# Patient Record
Sex: Male | Born: 1970 | Race: White | Hispanic: No | Marital: Single | Smoking: Never smoker
Health system: Southern US, Community
[De-identification: ages and names within clinical notes are randomized; demographics above are authoritative.]

## PROBLEM LIST (undated history)

## (undated) DIAGNOSIS — I10 Essential (primary) hypertension: Secondary | ICD-10-CM

## (undated) DIAGNOSIS — K219 Gastro-esophageal reflux disease without esophagitis: Secondary | ICD-10-CM

## (undated) DIAGNOSIS — R7989 Other specified abnormal findings of blood chemistry: Secondary | ICD-10-CM

## (undated) DIAGNOSIS — K649 Unspecified hemorrhoids: Secondary | ICD-10-CM

## (undated) HISTORY — PX: OTHER SURGICAL HISTORY: SHX169

---

## 2019-01-01 ENCOUNTER — Emergency Department
Admission: EM | Admit: 2019-01-01 | Discharge: 2019-01-01 | Disposition: A | Discharge status: Home / Self Care | Payer: BLUE CROSS/BLUE SHIELD | Attending: Emergency Medicine | Admitting: Emergency Medicine

## 2019-01-01 ENCOUNTER — Other Ambulatory Visit: Payer: Self-pay

## 2019-01-01 ENCOUNTER — Encounter: Payer: Self-pay | Admitting: Emergency Medicine

## 2019-01-01 ENCOUNTER — Emergency Department: Payer: BLUE CROSS/BLUE SHIELD

## 2019-01-01 DIAGNOSIS — R1013 Epigastric pain: Secondary | ICD-10-CM | POA: Diagnosis not present

## 2019-01-01 DIAGNOSIS — R079 Chest pain, unspecified: Secondary | ICD-10-CM | POA: Diagnosis present

## 2019-01-01 DIAGNOSIS — R195 Other fecal abnormalities: Secondary | ICD-10-CM | POA: Diagnosis not present

## 2019-01-01 LAB — COMPREHENSIVE METABOLIC PANEL
ALT: 12 U/L (ref 0–44)
AST: 15 U/L (ref 15–41)
Albumin: 3.8 g/dL (ref 3.5–5.0)
Alkaline Phosphatase: 43 U/L (ref 38–126)
Anion gap: 7 (ref 5–15)
BUN: 33 mg/dL — ABNORMAL HIGH (ref 6–20)
CO2: 25 mmol/L (ref 22–32)
Calcium: 8.4 mg/dL — ABNORMAL LOW (ref 8.9–10.3)
Chloride: 106 mmol/L (ref 98–111)
Creatinine, Ser: 1.06 mg/dL (ref 0.61–1.24)
GFR calc Af Amer: >60 mL/min (ref >60)
GFR calc non Af Amer: >60 mL/min (ref >60)
Glucose, Bld: 123 mg/dL — ABNORMAL HIGH (ref 70–99)
Potassium: 4.3 mmol/L (ref 3.5–5.1)
Sodium: 138 mmol/L (ref 135–145)
Total Bilirubin: 1.3 mg/dL — ABNORMAL HIGH (ref 0.3–1.2)
Total Protein: 6.5 g/dL (ref 6.5–8.1)

## 2019-01-01 LAB — CBC
HCT: 38.6 % — ABNORMAL LOW (ref 39.0–52.0)
Hemoglobin: 13 g/dL (ref 13.0–17.0)
MCH: 31.3 pg (ref 26.0–34.0)
MCHC: 33.7 g/dL (ref 30.0–36.0)
MCV: 92.8 fL (ref 80.0–100.0)
Platelets: 303 10*3/uL (ref 150–400)
RBC: 4.16 MIL/uL — ABNORMAL LOW (ref 4.22–5.81)
RDW: 13 % (ref 11.5–15.5)
WBC: 14.3 10*3/uL — ABNORMAL HIGH (ref 4.0–10.5)
nRBC: 0 % (ref 0.0–0.2)

## 2019-01-01 LAB — CBC WITH DIFFERENTIAL/PLATELET
Abs Immature Granulocytes: 0.1 10*3/uL — ABNORMAL HIGH (ref 0.00–0.07)
Basophils Absolute: 0.1 10*3/uL (ref 0.0–0.1)
Basophils Relative: 1 %
Eosinophils Absolute: 0.1 10*3/uL (ref 0.0–0.5)
Eosinophils Relative: 1 %
HCT: 37.5 % — ABNORMAL LOW (ref 39.0–52.0)
Hemoglobin: 12.6 g/dL — ABNORMAL LOW (ref 13.0–17.0)
Immature Granulocytes: 1 %
Lymphocytes Relative: 15 %
Lymphs Abs: 1.8 10*3/uL (ref 0.7–4.0)
MCH: 31.2 pg (ref 26.0–34.0)
MCHC: 33.6 g/dL (ref 30.0–36.0)
MCV: 92.8 fL (ref 80.0–100.0)
Monocytes Absolute: 0.6 10*3/uL (ref 0.1–1.0)
Monocytes Relative: 5 %
Neutro Abs: 9.6 10*3/uL — ABNORMAL HIGH (ref 1.7–7.7)
Neutrophils Relative %: 77 %
Platelets: 297 10*3/uL (ref 150–400)
RBC: 4.04 MIL/uL — ABNORMAL LOW (ref 4.22–5.81)
RDW: 13 % (ref 11.5–15.5)
WBC: 12.4 10*3/uL — ABNORMAL HIGH (ref 4.0–10.5)
nRBC: 0 % (ref 0.0–0.2)

## 2019-01-01 LAB — TROPONIN I (HIGH SENSITIVITY): Troponin I (High Sensitivity): 3 ng/L (ref <18)

## 2019-01-01 LAB — TYPE AND SCREEN
ABO/RH(D): A POS
Antibody Screen: NEGATIVE

## 2019-01-01 LAB — LIPASE, BLOOD: Lipase: 32 U/L (ref 11–51)

## 2019-01-01 NOTE — ED Notes (Signed)
Rectal exam to obtain stool occult sample by MD Siadecki with this RN at bedside

## 2019-01-01 NOTE — Discharge Instructions (Addendum)
You most likely either have an ulcer in your stomach or gastritis (inflammation of the stomach lining) which is causing the blood to show up in the stool.  You should start taking your Prilosec regularly every day going forward.  Call on Monday to make an appointment with the gastroenterologist (information has been provided in the paper) as well as with your primary care doctor.  Return to the ER immediately for recurrent, worsening, or persistent weakness or lightheadedness, worsening pain, vomiting, any worsening dark stools or especially if you see any actual blood in the stool.

## 2019-01-01 NOTE — ED Provider Notes (Signed)
[facility] Emergency Department Provider Note ____________________________________________   First MD Initiated Contact with Patient 01/01/19 [phone]     (approximate)  I have reviewed the triage vital signs and the nursing notes.   HISTORY  Chief Complaint Chest Pain and GI Bleeding    HPI William Ballard is a 48 y.o. male with no significant PMH who presents with lower chest and epigastric pain over at least the last few months, intermittent course, and somewhat more constant over the last few days.  He reports that it has been associated with dark stools in the last few days.  The patient states that previously for the pain he was given Prilosec and states that he takes it intermittently.  Patient denies nausea or vomiting, diarrhea, fever, or weakness.  However he does report feeling briefly near syncopal early this morning.  This has now resolved.   History reviewed. No pertinent past medical history.  There are no active problems to display for this patient.   History reviewed. No pertinent surgical history.  Prior to Admission medications   Not on File    Allergies Patient has no known allergies.  No family history on file.  Social History Social History   Tobacco Use  . Smoking status: Never Smoker  . Smokeless tobacco: Never Used  Substance Use Topics  . Alcohol use: Not Currently  . Drug use: Not Currently    Review of Systems  Constitutional: No fever. Eyes: No redness. ENT: No sore throat. Cardiovascular: Denies chest pain. Respiratory: Denies shortness of breath. Gastrointestinal: No vomiting or diarrhea.  Genitourinary: Negative for dysuria.  Musculoskeletal: Negative for back pain. Skin: Negative for rash. Neurological: Negative for headache.   ____________________________________________   PHYSICAL EXAM:  VITAL SIGNS: ED Triage Vitals [01/01/19 0029]  Enc Vitals Group     BP 125/83     Pulse Rate 89     Resp 18      Temp 97.9 F (36.6 C)     Temp Source Oral     SpO2 100 %     Weight 200 lb (90.7 kg)     Height 5\' 6"  (1.676 m)     Head Circumference      Peak Flow      Pain Score 5     Pain Loc      Pain Edu?      Excl. in GC?     Constitutional: Alert and oriented. Well appearing and in no acute distress. Eyes: Conjunctivae are normal.  Head: Atraumatic. Nose: No congestion/rhinnorhea. Mouth/Throat: Mucous membranes are moist.   Neck: Normal range of motion.  Cardiovascular: Normal rate, regular rhythm. Good peripheral circulation. Respiratory: Normal respiratory effort.  No retractions.  Gastrointestinal: Soft and nontender. No distention.  Musculoskeletal: No lower extremity edema.  Extremities warm and well perfused.  Neurologic:  Normal speech and language. No gross focal neurologic deficits are appreciated.  Skin:  Skin is warm and dry. No rash noted. Psychiatric: Mood and affect are normal. Speech and behavior are normal.  ____________________________________________   LABS (all labs ordered are listed, but only abnormal results are displayed)  Labs Reviewed  COMPREHENSIVE METABOLIC PANEL - Abnormal; Notable for the following components:      Result Value   Glucose, Bld 123 (*)    BUN 33 (*)    Calcium 8.4 (*)    Total Bilirubin 1.3 (*)    All other components within normal limits  CBC - Abnormal; Notable for the  following components:   WBC 14.3 (*)    RBC 4.16 (*)    HCT 38.6 (*)    All other components within normal limits  CBC WITH DIFFERENTIAL/PLATELET - Abnormal; Notable for the following components:   WBC 12.4 (*)    RBC 4.04 (*)    Hemoglobin 12.6 (*)    HCT 37.5 (*)    Neutro Abs 9.6 (*)    Abs Immature Granulocytes 0.10 (*)    All other components within normal limits  LIPASE, BLOOD  POC OCCULT BLOOD, ED  TYPE AND SCREEN  TROPONIN I (HIGH SENSITIVITY)   ____________________________________________  EKG  ED ECG REPORT I, Arta Silence, the  attending physician, personally viewed and interpreted this ECG.  Date: 01/01/2019 EKG Time: 1236 Rate: 88 Rhythm: normal sinus rhythm QRS Axis: normal Intervals: Incomplete RBBB ST/T Wave abnormalities: normal Narrative Interpretation: no evidence of acute ischemia  ____________________________________________  RADIOLOGY  CXR: No acute abnormalities  ____________________________________________   PROCEDURES  Procedure(s) performed: No  Procedures  Critical Care performed: No ____________________________________________   INITIAL IMPRESSION / ASSESSMENT AND PLAN / ED COURSE  Pertinent labs & imaging results that were available during my care of the patient were reviewed by me and considered in my medical decision making (see chart for details).   48 year old male with no significant PMH presents with intermittent epigastric/lower chest area pain which has been going on for at least several months and likely longer for which he was prescribed Prilosec; he states he only intermittently takes it.  Recently over the last few days he developed dark stools.  He also had a brief near syncopal episode early this morning but this has resolved and he feels well now.  On exam, the patient is well-appearing and his vital signs are normal.  The physical exam is unremarkable.  DRE was performed; he has dark brown stool which is guaiac positive.  Lab work-up obtained from triage is within normal limits except for elevated WBC count which is nonspecific.  His hemoglobin is normal.  Overall presentation is most consistent with PUD or gastritis.  Given that the patient is clinically stable, he likely will not require inpatient admission.  We will obtain a repeat CBC now that he has been here for approximately 7 hours.  If it is stable, I will discharge home with GI referral and instructions to take the PPI continuously.  ----------------------------------------- 8:42 AM on 01/01/2019  -----------------------------------------  Repeat CBC shows no significant change, and the nonspecific elevated WBC has come down on its own.  The patient's vital signs remained stable including when he stands up, and he has no weakness or lightheadedness.  At this time, the patient is stable for discharge home and for outpatient follow-up of what is likely gastritis or PUD.  I have provided GI referral and instructed him to call his primary care doctor on Monday as well.  I gave the patient thorough return precautions and he expressed understanding.  He feels comfortable going home at this time.  ____________________________________________   FINAL CLINICAL IMPRESSION(S) / ED DIAGNOSES  Final diagnoses:  Epigastric pain  Occult blood in stools      NEW MEDICATIONS STARTED DURING THIS VISIT:  New Prescriptions   No medications on file     Note:  This document was prepared using Dragon voice recognition software and may include unintentional dictation errors.    Arta Silence, MD 01/01/19 [phone]

## 2019-01-01 NOTE — ED Triage Notes (Signed)
Patient with complaint of intermittent chest pain for years. Patient states that the pain has become worse. Patient states that the patient that his md had him taking prevacid but it has not helped. Patient states that over the last 3 days he has had black stools.

## 2019-01-03 ENCOUNTER — Ambulatory Visit: Payer: BLUE CROSS/BLUE SHIELD

## 2019-01-03 ENCOUNTER — Other Ambulatory Visit: Payer: Self-pay | Admitting: Gastroenterology

## 2019-01-03 ENCOUNTER — Other Ambulatory Visit: Payer: Self-pay

## 2019-01-03 ENCOUNTER — Ambulatory Visit
Admission: RE | Admit: 2019-01-03 | Discharge: 2019-01-03 | Disposition: A | Discharge status: Home / Self Care | Payer: BLUE CROSS/BLUE SHIELD | Source: Ambulatory Visit | Attending: Gastroenterology | Admitting: Gastroenterology

## 2019-01-03 DIAGNOSIS — R0989 Other specified symptoms and signs involving the circulatory and respiratory systems: Secondary | ICD-10-CM

## 2019-01-03 DIAGNOSIS — K921 Melena: Secondary | ICD-10-CM

## 2019-01-03 DIAGNOSIS — R109 Unspecified abdominal pain: Secondary | ICD-10-CM

## 2019-01-03 MED ORDER — IOPAMIDOL (ISOVUE-370) INJECTION 76%
100.0000 mL | Freq: Once | INTRAVENOUS | Status: AC | PRN
  Administered 2019-01-03: 15:00:00 100 mL via INTRAVENOUS

## 2019-01-04 ENCOUNTER — Ambulatory Visit: Payer: BLUE CROSS/BLUE SHIELD | Admitting: Gastroenterology

## 2019-01-04 ENCOUNTER — Other Ambulatory Visit
Admission: RE | Admit: 2019-01-04 | Discharge: 2019-01-04 | Disposition: A | Discharge status: Home / Self Care | Payer: BLUE CROSS/BLUE SHIELD | Source: Ambulatory Visit | Attending: Gastroenterology | Admitting: Gastroenterology

## 2019-01-04 DIAGNOSIS — Z1159 Encounter for screening for other viral diseases: Secondary | ICD-10-CM | POA: Insufficient documentation

## 2019-01-04 LAB — SARS CORONAVIRUS 2 (TAT 6-24 HRS): SARS Coronavirus 2: NEGATIVE

## 2019-01-05 ENCOUNTER — Other Ambulatory Visit: Payer: Self-pay

## 2019-01-05 ENCOUNTER — Encounter: Payer: Self-pay | Admitting: *Deleted

## 2019-01-05 ENCOUNTER — Inpatient Hospital Stay
Admission: EM | Admit: 2019-01-05 | Discharge: 2019-01-06 | DRG: 378 | Disposition: A | Discharge status: Home / Self Care | Payer: BLUE CROSS/BLUE SHIELD | Attending: Internal Medicine | Admitting: Internal Medicine

## 2019-01-05 DIAGNOSIS — Z1159 Encounter for screening for other viral diseases: Secondary | ICD-10-CM

## 2019-01-05 DIAGNOSIS — D62 Acute posthemorrhagic anemia: Secondary | ICD-10-CM | POA: Diagnosis present

## 2019-01-05 DIAGNOSIS — K264 Chronic or unspecified duodenal ulcer with hemorrhage: Principal | ICD-10-CM | POA: Diagnosis present

## 2019-01-05 DIAGNOSIS — D500 Iron deficiency anemia secondary to blood loss (chronic): Secondary | ICD-10-CM

## 2019-01-05 DIAGNOSIS — I10 Essential (primary) hypertension: Secondary | ICD-10-CM | POA: Diagnosis present

## 2019-01-05 DIAGNOSIS — Z79899 Other long term (current) drug therapy: Secondary | ICD-10-CM

## 2019-01-05 DIAGNOSIS — E785 Hyperlipidemia, unspecified: Secondary | ICD-10-CM | POA: Diagnosis present

## 2019-01-05 DIAGNOSIS — Z7982 Long term (current) use of aspirin: Secondary | ICD-10-CM | POA: Diagnosis not present

## 2019-01-05 DIAGNOSIS — E876 Hypokalemia: Secondary | ICD-10-CM | POA: Diagnosis present

## 2019-01-05 DIAGNOSIS — K2970 Gastritis, unspecified, without bleeding: Secondary | ICD-10-CM | POA: Diagnosis present

## 2019-01-05 DIAGNOSIS — K921 Melena: Secondary | ICD-10-CM | POA: Diagnosis present

## 2019-01-05 DIAGNOSIS — K922 Gastrointestinal hemorrhage, unspecified: Secondary | ICD-10-CM | POA: Diagnosis present

## 2019-01-05 LAB — COMPREHENSIVE METABOLIC PANEL
ALT: 20 U/L (ref 0–44)
AST: 19 U/L (ref 15–41)
Albumin: 3.8 g/dL (ref 3.5–5.0)
Alkaline Phosphatase: 42 U/L (ref 38–126)
Anion gap: 12 (ref 5–15)
BUN: 14 mg/dL (ref 6–20)
CO2: 20 mmol/L — ABNORMAL LOW (ref 22–32)
Calcium: 8.3 mg/dL — ABNORMAL LOW (ref 8.9–10.3)
Chloride: 103 mmol/L (ref 98–111)
Creatinine, Ser: 1.2 mg/dL (ref 0.61–1.24)
GFR calc Af Amer: >60 mL/min (ref >60)
GFR calc non Af Amer: >60 mL/min (ref >60)
Glucose, Bld: 104 mg/dL — ABNORMAL HIGH (ref 70–99)
Potassium: 3.2 mmol/L — ABNORMAL LOW (ref 3.5–5.1)
Sodium: 135 mmol/L (ref 135–145)
Total Bilirubin: 1.3 mg/dL — ABNORMAL HIGH (ref 0.3–1.2)
Total Protein: 6.4 g/dL — ABNORMAL LOW (ref 6.5–8.1)

## 2019-01-05 LAB — CBC WITH DIFFERENTIAL/PLATELET
Abs Immature Granulocytes: 0.13 10*3/uL — ABNORMAL HIGH (ref 0.00–0.07)
Basophils Absolute: 0.1 10*3/uL (ref 0.0–0.1)
Basophils Relative: 1 %
Eosinophils Absolute: 0.1 10*3/uL (ref 0.0–0.5)
Eosinophils Relative: 1 %
HCT: 27.3 % — ABNORMAL LOW (ref 39.0–52.0)
Hemoglobin: 9.4 g/dL — ABNORMAL LOW (ref 13.0–17.0)
Immature Granulocytes: 1 %
Lymphocytes Relative: 20 %
Lymphs Abs: 2.5 10*3/uL (ref 0.7–4.0)
MCH: 31.6 pg (ref 26.0–34.0)
MCHC: 34.4 g/dL (ref 30.0–36.0)
MCV: 91.9 fL (ref 80.0–100.0)
Monocytes Absolute: 0.9 10*3/uL (ref 0.1–1.0)
Monocytes Relative: 7 %
Neutro Abs: 8.9 10*3/uL — ABNORMAL HIGH (ref 1.7–7.7)
Neutrophils Relative %: 70 %
Platelets: 332 10*3/uL (ref 150–400)
RBC: 2.97 MIL/uL — ABNORMAL LOW (ref 4.22–5.81)
RDW: 14.2 % (ref 11.5–15.5)
WBC: 12.6 10*3/uL — ABNORMAL HIGH (ref 4.0–10.5)
nRBC: 0.2 % (ref 0.0–0.2)

## 2019-01-05 LAB — LIPID PANEL
Cholesterol: 126 mg/dL (ref 0–200)
HDL: 30 mg/dL — ABNORMAL LOW (ref >40)
LDL Cholesterol: 83 mg/dL (ref 0–99)
Total CHOL/HDL Ratio: 4.2 RATIO
Triglycerides: 66 mg/dL (ref <150)
VLDL: 13 mg/dL (ref 0–40)

## 2019-01-05 LAB — PROTIME-INR
INR: 1.1 (ref 0.8–1.2)
Prothrombin Time: 14.4 seconds (ref 11.4–15.2)

## 2019-01-05 LAB — SAMPLE TO BLOOD BANK

## 2019-01-05 MED ORDER — SUCRALFATE 1 G PO TABS
1.0000 g | ORAL_TABLET | Freq: Two times a day (BID) | ORAL | Status: DC
  Filled 2019-01-05: qty 1

## 2019-01-05 MED ORDER — ADULT MULTIVITAMIN W/MINERALS CH: 1.0000 | ORAL_TABLET | Freq: Every day | ORAL | Status: DC

## 2019-01-05 MED ORDER — SODIUM CHLORIDE 0.9 % IV SOLN
80.0000 mg | Freq: Once | INTRAVENOUS | Status: AC
  Administered 2019-01-05: 80 mg via INTRAVENOUS
  Filled 2019-01-05: qty 80

## 2019-01-05 MED ORDER — HYDRALAZINE HCL 20 MG/ML IJ SOLN: 10.0000 mg | Freq: Four times a day (QID) | INTRAMUSCULAR | Status: DC | PRN

## 2019-01-05 MED ORDER — SODIUM CHLORIDE 0.9 % IV SOLN
8.0000 mg/h | INTRAVENOUS | Status: DC
  Administered 2019-01-05 – 2019-01-06 (×3): 8 mg/h via INTRAVENOUS
  Filled 2019-01-05 (×3): qty 80

## 2019-01-05 MED ORDER — PANTOPRAZOLE SODIUM 40 MG IV SOLR: 40.0000 mg | Freq: Two times a day (BID) | INTRAVENOUS | Status: DC

## 2019-01-05 MED ORDER — SODIUM CHLORIDE 0.9 % IV SOLN
INTRAVENOUS | Status: DC
  Administered 2019-01-05 – 2019-01-06 (×2): via INTRAVENOUS

## 2019-01-05 NOTE — Progress Notes (Signed)
Pt arrived to floor, A&O x4, amb to bathroom with steady gait, however, pt reports some "woozy feelings". Pt educated to call before getting up and educated on the "Call, dont fall". Pt verbalizes understanding, provided with grippy socks and NT aware of pt's standby status.

## 2019-01-05 NOTE — Consult Note (Signed)
GI Inpatient Consult Note  Reason for Consult: Melena, Acute blood loss anemia   Attending Requesting Consult: William SilversmithElizabeth Ouma, NP  History of Present Illness: William GuarneriJames Ballard is a 48 y.o. male seen for evaluation of melena, acute blood loss anemia at the request of William SilversmithElizabeth Ouma, NP. Pt presented to the ED today for progressive melena and associated generalized weakness and fatigue. He reports over the past 5 days seeing melena which has progressively gotten worse. He was seen at [facility] ED 07/18 for melena where he had hgb 12.4 with DREr showing dark brown stool which was guaiac positive. He was discharged with GI f/u and saw [facility] at [facility] where he was found to have hgb 10.6. He had hemoglobin of 16.9 appx 8 months ago. He also had CTA performed yesterday 2/2 abdominal bruit and epigastric/peribumbilical abd pain which was negative for acute pathology. He was scheduled to have EGD by William Ballard on Friday (07/24). He was on the phone with medical assistant this morning with [facility] where he had near syncopal episode and had episode of melena. He was advised to come to the ED for further evaluation. Upon presentation to the ED, hemoglobin was found to be 9.4. He endorses epigastric and periumbilical abd pain that he describes as aching and crampy in nature. He was started on Pantoprazole 40 mg BID by Vevelyn Pathristiane Ballard and reports this has helped relieve severity of abd pain. He rates pain 5/10 currently with associated nausea w/o emesis. No radiation of his pain. He reports food doesn't make pain better or worse. His appetite has been decreased over the past week. He was taking a full dose aspirin for preventative measures up until last Friday. No other frequent NSAID use. He denies any personal hx of peptic ulcer disease or GI bleeding. No hematochezia. He denies any fever, new skin rashes, recurrent mouth ulcers, or first-degree relatives with history of CCA or adenomatous polyps. He  does endorse a 5-10 lb unintentional wt loss over the past 3 months.    Last Colonoscopy: N/A Last Endoscopy: N/A   Past Medical History:  Past Medical History:  Diagnosis Date  . Hypertension     Problem List: Patient Active Problem List   Diagnosis Date Noted  . GI bleeding 01/05/2019    Past Surgical History: History reviewed. No pertinent surgical history.  Allergies: No Known Allergies  Home Medications: (Not in a hospital admission)  Home medication reconciliation was completed with the patient.   Scheduled Inpatient Medications:   . [START ON 01/06/2019] multivitamin with minerals  1 tablet Oral Daily  . [START ON 01/09/2019] pantoprazole  40 mg Intravenous Q12H  . sucralfate  1 g Oral BID AC    Continuous Inpatient Infusions:   . sodium chloride    . pantoprazole (PROTONIX) IVPB    . pantoprozole (PROTONIX) infusion      PRN Inpatient Medications:  hydrALAZINE  Family History: family history is not on file.  The patient's family history is negative for inflammatory bowel disorders, GI malignancy, or solid organ transplantation.  Social History:   reports that he has never smoked. He has never used smokeless tobacco. He reports previous alcohol use. He reports previous drug use. The patient denies ETOH, tobacco, or drug use.   Review of Systems: Constitutional: Weight is stable.  Eyes: No changes in vision. ENT: No oral lesions, sore throat.  GI: see HPI.  Heme/Lymph: No easy bruising.  CV: No chest pain.  GU: No hematuria.  Integumentary: No rashes.  Neuro: No headaches.  Psych: No depression/anxiety.  Endocrine: No heat/cold intolerance.  Allergic/Immunologic: No urticaria.  Resp: No cough, SOB.  Musculoskeletal: No joint swelling.    Physical Examination: BP (!) 163/71 (BP Location: Left Arm)   Pulse (!) 108   Temp 98.4 F (36.9 C) (Oral)   Resp 20   Ht 5\' 6"  (1.676 m)   Wt 90.7 kg   SpO2 100%   BMI 32.28 kg/m  Gen: NAD, alert and  oriented x 4 HEENT: PEERLA, EOMI, Neck: supple, no JVD or thyromegaly Chest: CTA bilaterally, no wheezes, crackles, or other adventitious sounds CV: RRR, no m/g/c/r Abd: soft, NT, ND, +BS in all four quadrants; no HSM, guarding, ridigity, or rebound tenderness Ext: no edema, well perfused with 2+ pulses, Skin: no rash or lesions noted Lymph: no LAD  Data: Lab Results  Component Value Date   WBC 12.6 (H) 01/05/2019   HGB 9.4 (L) 01/05/2019   HCT 27.3 (L) 01/05/2019   MCV 91.9 01/05/2019   PLT 332 01/05/2019   Recent Labs  Lab 01/01/19 0043 01/01/19 0725 01/05/19 1402  HGB 13.0 12.6* 9.4*   Lab Results  Component Value Date   NA 135 01/05/2019   K 3.2 (L) 01/05/2019   CL 103 01/05/2019   CO2 20 (L) 01/05/2019   BUN 14 01/05/2019   CREATININE 1.20 01/05/2019   Lab Results  Component Value Date   ALT 20 01/05/2019   AST 19 01/05/2019   ALKPHOS 42 01/05/2019   BILITOT 1.3 (H) 01/05/2019   Recent Labs  Lab 01/05/19 1402  INR 1.1   Assessment/Plan:  48 y/o Caucasian male with a PMH of HTN, HLD, and hemorrhoids presents to ED for near syncope and progressive melena x 1 week  1. Melena - likely upper GI bleed, differential includes peptic ulcer disease, gastritis +/- H pylori, esophagitis, Dieulafoy's lesion, Mallory-Weiss tear, duodenitis, AVMs, right colon source, neoplastic changes  2. Acute blood loss anemia - Hgb currently 9.4, was 12 4 days ago, 8 months ago hgb 16.9  - Remain NPO - Continue to monitor H&H. Transfuse for Hgb <7.0. - Continue acid suppression - Hold all anticoagulation/antiplatelet therapy - I advise luminal evaluation with EGD. I reviewed the risks (including bleeding, perforation, infection, anesthesia complications, cardiac/respiratory complications), benefits and alternatives of EGD. Patient consents to proceed.  - Plan for EGD tomorrow afternoon with Dr. Alice Ballard - If EGD is negative, he will need colonoscopy to rule out right colon source  of bleeding or lower GI malignancy.   Thank you for the consult. Please call with questions or concerns.  [facility] [phone] (Cell)

## 2019-01-05 NOTE — ED Notes (Signed)
ED TO INPATIENT HANDOFF REPORT  ED Nurse Name and Phone #: Shanda BumpsJessica [phone]   S Name/Age/Gender William FearingJames Ballard 48 y.o. male Room/Bed: ED19HA/ED19HA  Code Status   Code Status: Full Code  Home/SNF/Other Home Patient oriented to: self, place, time and situation Is this baseline? Yes   Triage Complete: Triage complete  Chief Complaint black stool  Triage Note Pt arrives via [facility] for anemia, near syncope and bloody stools. Pt seen last week and was dx with anemia. Pt reports coffee ground stool last Wednesday, no vomiting. Pt [facility] this morning where labs were done and he was told his hgb was 9.5.    Allergies No Known Allergies  Level of Care/Admitting Diagnosis ED Disposition    ED Disposition Condition Comment   Admit  Hospital Area: [facility] [[id number]]  Level of Care: Med-Surg [16]  Covid Evaluation: Confirmed COVID Negative  Diagnosis: GI bleeding [[id number]][[id number]]  Admitting Physician: Webb SilversmithOUMA, [facility]ETH [facility] [[id number]]  Attending Physician: Webb SilversmithUMA, ELIZABETH ACHIENG [[id number]]  Estimated length of stay: past midnight tomorrow  Certification:: I certify this patient will need inpatient services for at least 2 midnights  PT Class (Do Not Modify): Inpatient [101]  PT Acc Code (Do Not Modify): Private [1]       B Medical/Surgery History Past Medical History:  Diagnosis Date  . Hypertension    History reviewed. No pertinent surgical history.   A IV Location/Drains/Wounds Patient Lines/Drains/Airways Status   Active Line/Drains/Airways    Name:   Placement date:   Placement time:   Site:   Days:   Peripheral IV 01/03/19 Distal;Right Antecubital   01/03/19    1509    Antecubital   2   Peripheral IV 01/05/19 Right Antecubital   01/05/19    1328    Antecubital   less than 1          Intake/Output Last 24 hours No intake or output data in the 24 hours ending 01/05/19 1623  Labs/Imaging Results for orders placed or performed during the  hospital encounter of 01/05/19 (from the past 48 hour(s))  Sample to Blood Bank     Status: None   Collection Time: 01/05/19  2:02 PM  Result Value Ref Range   Blood Bank Specimen SAMPLE AVAILABLE FOR TESTING    Sample Expiration      01/08/2019,2359 Performed at [facility], [address] [place], [place] [address]   CBC with Differential     Status: Abnormal   Collection Time: 01/05/19  2:02 PM  Result Value Ref Range   WBC 12.6 (H) 4.0 - 10.5 K/uL   RBC 2.97 (L) 4.22 - 5.81 MIL/uL   Hemoglobin 9.4 (L) 13.0 - 17.0 g/dL   HCT 40.127.3 (L) 02.739.0 - 25.352.0 %   MCV 91.9 80.0 - 100.0 fL   MCH 31.6 26.0 - 34.0 pg   MCHC 34.4 30.0 - 36.0 g/dL   RDW 66.414.2 40.311.5 - 47.415.5 %   Platelets 332 150 - 400 K/uL   nRBC 0.2 0.0 - 0.2 %   Neutrophils Relative % 70 %   Neutro Abs 8.9 (H) 1.7 - 7.7 K/uL   Lymphocytes Relative 20 %   Lymphs Abs 2.5 0.7 - 4.0 K/uL   Monocytes Relative 7 %   Monocytes Absolute 0.9 0.1 - 1.0 K/uL   Eosinophils Relative 1 %   Eosinophils Absolute 0.1 0.0 - 0.5 K/uL   Basophils Relative 1 %   Basophils Absolute 0.1 0.0 - 0.1  K/uL   Immature Granulocytes 1 %   Abs Immature Granulocytes 0.13 (H) 0.00 - 0.07 K/uL    Comment: Performed at [facility], [address] [place], [place] [address]  Comprehensive metabolic panel     Status: Abnormal   Collection Time: 01/05/19  2:02 PM  Result Value Ref Range   Sodium 135 135 - 145 mmol/L   Potassium 3.2 (L) 3.5 - 5.1 mmol/L   Chloride 103 98 - 111 mmol/L   CO2 20 (L) 22 - 32 mmol/L   Glucose, Bld 104 (H) 70 - 99 mg/dL   BUN 14 6 - 20 mg/dL   Creatinine, Ser 1.20 0.61 - 1.24 mg/dL   Calcium 8.3 (L) 8.9 - 10.3 mg/dL   Total Protein 6.4 (L) 6.5 - 8.1 g/dL   Albumin 3.8 3.5 - 5.0 g/dL   AST 19 15 - 41 U/L   ALT 20 0 - 44 U/L   Alkaline Phosphatase 42 38 - 126 U/L   Total Bilirubin 1.3 (H) 0.3 - 1.2 mg/dL   GFR calc non Af Amer >60 >60 mL/min   GFR calc Af Amer >60 >60 mL/min   Anion gap 12 5 - 15     Comment: Performed at [facility], [address] [place], [place] [address]  Protime-INR     Status: None   Collection Time: 01/05/19  2:02 PM  Result Value Ref Range   Prothrombin Time 14.4 11.4 - 15.2 seconds   INR 1.1 0.8 - 1.2    Comment: (NOTE) INR goal varies based on device and disease states. Performed at [facility], [address] [place], [place] [address]    No results found.  Pending Labs Unresulted Labs (From admission, onward)    Start     Ordered   01/05/19 1605  HIV antibody (Routine Testing)  Once,   STAT     01/05/19 1610          Vitals/Pain Today's Vitals   01/05/19 1351 01/05/19 1352 01/05/19 1354 01/05/19 1615  BP: (!) 177/66   (!) 163/71  Pulse: (!) 112   (!) 108  Resp: 16   20  Temp: 98.4 F (36.9 C)     TempSrc: Oral     SpO2: 100%   100%  Weight:  90.7 kg    Height:  5\' 6"  (1.676 m)    PainSc:   2      Isolation Precautions No active isolations  Medications Medications  pantoprazole (PROTONIX) 80 mg in sodium chloride 0.9 % 100 mL IVPB (has no administration in time range)  pantoprazole (PROTONIX) 80 mg in sodium chloride 0.9 % 250 mL (0.32 mg/mL) infusion (has no administration in time range)  pantoprazole (PROTONIX) injection 40 mg (has no administration in time range)  sucralfate (CARAFATE) tablet 1 g (has no administration in time range)  multivitamin with minerals tablet 1 tablet (has no administration in time range)  0.9 %  sodium chloride infusion (has no administration in time range)    Mobility walks Low fall risk   Focused Assessments 1   R Recommendations: See Admitting Provider Note  Report given to:   Additional Notes:

## 2019-01-05 NOTE — ED Notes (Signed)
Pt ambulated to bathroom without assistance, gait steady.

## 2019-01-05 NOTE — ED Provider Notes (Signed)
[facility] Emergency Department Provider Note  ____________________________________________   First MD Initiated Contact with Patient 01/05/19 1405     (approximate)  I have reviewed the triage vital signs and the nursing notes.   HISTORY  Chief Complaint Anemia    HPI William Ballard is a 48 y.o. male with history of hypertension here with epigastric discomfort and bloody stools.  The patient states that over the last several days, he has had progressively worsening melena and epigastric pain.  He was recently seen by his PCP and had a CT of the abdomen pelvis which was negative.  He is scheduled for an EGD with Dr. Gustavo Lah tomorrow.  However, over the last several days, his hemoglobin has reportedly trended down greater than 1 unit.  He has had increasing amounts of melena as well as coffee-ground type stool.  Denies any vomiting.  He said nausea and poor appetite.  His pain is aching, throbbing, and epigastric that radiates to the back.  No alleviating factors.  No history of ulcers.  Denies NSAID use.   Past Medical History:  Diagnosis Date   Hypertension     There are no active problems to display for this patient.   History reviewed. No pertinent surgical history.  Prior to Admission medications   Medication Sig Start Date End Date Taking? Authorizing Provider  aspirin 325 MG tablet Take 325 mg by mouth daily.   Yes [provider]  Multiple Vitamin (MULTIVITAMIN) tablet Take 1 tablet by mouth daily.   Yes [provider]  pantoprazole (PROTONIX) 40 MG tablet Take 40 mg by mouth daily.   Yes [provider]  sucralfate (CARAFATE) 1 g tablet Take 1 g by mouth 2 (two) times daily before a meal. 01/04/19 01/04/20 Yes [provider]  diphenhydrAMINE (BENADRYL) 25 MG tablet Take 25 mg by mouth every 6 (six) hours as needed.    [provider]    Allergies Patient has no known allergies.  History reviewed.  No pertinent family history.  Social History Social History   Tobacco Use   Smoking status: Never Smoker   Smokeless tobacco: Never Used  Substance Use Topics   Alcohol use: Not Currently   Drug use: Not Currently    Review of Systems  Review of Systems  Constitutional: Positive for fatigue. Negative for chills and fever.  HENT: Negative for sore throat.   Respiratory: Negative for shortness of breath.   Cardiovascular: Negative for chest pain.  Gastrointestinal: Positive for abdominal pain, blood in stool and nausea.  Genitourinary: Negative for flank pain.  Musculoskeletal: Negative for neck pain.  Skin: Negative for rash and wound.  Allergic/Immunologic: Negative for immunocompromised state.  Neurological: Positive for weakness. Negative for numbness.  Hematological: Does not bruise/bleed easily.  All other systems reviewed and are negative.    ____________________________________________  PHYSICAL EXAM:      VITAL SIGNS: ED Triage Vitals  Enc Vitals Group     BP 01/05/19 1351 (!) 177/66     Pulse Rate 01/05/19 1351 (!) 112     Resp 01/05/19 1351 16     Temp 01/05/19 1351 98.4 F (36.9 C)     Temp Source 01/05/19 1351 Oral     SpO2 01/05/19 1351 100 %     Weight 01/05/19 1352 200 lb (90.7 kg)     Height 01/05/19 1352 5\' 6"  (1.676 m)     Head Circumference --      Peak Flow --  Pain Score 01/05/19 1354 2     Pain Loc --      Pain Edu? --      Excl. in GC? --      Physical Exam Vitals signs and nursing note reviewed.  Constitutional:      General: He is not in acute distress.    Appearance: He is well-developed.  HENT:     Head: Normocephalic and atraumatic.  Eyes:     Conjunctiva/sclera: Conjunctivae normal.  Neck:     Musculoskeletal: Neck supple.  Cardiovascular:     Rate and Rhythm: Normal rate and regular rhythm.     Heart sounds: Normal heart sounds. No murmur. No friction rub.  Pulmonary:     Effort: Pulmonary effort is normal. No  respiratory distress.     Breath sounds: Normal breath sounds. No wheezing or rales.  Abdominal:     General: There is no distension.     Palpations: Abdomen is soft.     Tenderness: There is abdominal tenderness. There is no guarding or rebound.  Skin:    General: Skin is warm.     Capillary Refill: Capillary refill takes less than 2 seconds.  Neurological:     Mental Status: He is alert and oriented to person, place, and time.     Motor: No abnormal muscle tone.       ____________________________________________   LABS (all labs ordered are listed, but only abnormal results are displayed)  Labs Reviewed  CBC WITH DIFFERENTIAL/PLATELET - Abnormal; Notable for the following components:      Result Value   WBC 12.6 (*)    RBC 2.97 (*)    Hemoglobin 9.4 (*)    HCT 27.3 (*)    Neutro Abs 8.9 (*)    Abs Immature Granulocytes 0.13 (*)    All other components within normal limits  COMPREHENSIVE METABOLIC PANEL - Abnormal; Notable for the following components:   Potassium 3.2 (*)    CO2 20 (*)    Glucose, Bld 104 (*)    Calcium 8.3 (*)    Total Protein 6.4 (*)    Total Bilirubin 1.3 (*)    All other components within normal limits  PROTIME-INR  SAMPLE TO BLOOD BANK    ____________________________________________  EKG: None ________________________________________  RADIOLOGY All imaging, including plain films, CT scans, and ultrasounds, independently reviewed by me, and interpretations confirmed via formal radiology reads.  ED MD interpretation:   None  Official radiology report(s): No results found.  ____________________________________________  PROCEDURES   Procedure(s) performed (including Critical Care):  Procedures  ____________________________________________  INITIAL IMPRESSION / MDM / ASSESSMENT AND PLAN / ED COURSE  As part of my medical decision making, I reviewed the following data within the electronic MEDICAL RECORD NUMBER Notes from prior ED  visits and [place] Controlled Substance Database      *William GuarneriJames Ballard was evaluated in Emergency Department on 01/05/2019 for the symptoms described in the history of present illness. He was evaluated in the context of the global COVID-19 pandemic, which necessitated consideration that the patient might be at risk for infection with the SARS-CoV-2 virus that causes COVID-19. Institutional protocols and algorithms that pertain to the evaluation of patients at risk for COVID-19 are in a state of rapid change based on information released by regulatory bodies including the CDC and federal and state organizations. These policies and algorithms were followed during the patient's care in the ED.  Some ED evaluations and interventions may be delayed as a  result of limited staffing during the pandemic.*      Medical Decision Making: 48 year old male here with ongoing epigastric pain and melena.  Suspect gastritis versus peptic or duodenal ulcer. He has not tried anything for his symptoms.  He is hemodynamically stable currently.  Will start on protonix, admit. GI will be made aware. ____________________________________________  FINAL CLINICAL IMPRESSION(S) / ED DIAGNOSES  Final diagnoses:  UGIB (upper gastrointestinal bleed)  Blood loss anemia     MEDICATIONS GIVEN DURING THIS VISIT:  Medications  pantoprazole (PROTONIX) 80 mg in sodium chloride 0.9 % 100 mL IVPB (has no administration in time range)  pantoprazole (PROTONIX) 80 mg in sodium chloride 0.9 % 250 mL (0.32 mg/mL) infusion (has no administration in time range)  pantoprazole (PROTONIX) injection 40 mg (has no administration in time range)     ED Discharge Orders    None       Note:  This document was prepared using Dragon voice recognition software and may include unintentional dictation errors.   Shaune PollackIsaacs, Kiwana Deblasi, MD 01/05/19 1535

## 2019-01-05 NOTE — H&P (Addendum)
examined, agree with detailed note below. Patient presentation and plan discussed on rounds with APP.    [facility] [place] [facility]   PATIENT NAME: William Ballard    MR#:  [id number]  DATE OF BIRTH:  12/10/1970  DATE OF ADMISSION:  01/05/2019  PRIMARY CARE PHYSICIAN: Danella PentonMiller, Mark F, MD   REQUESTING/REFERRING PHYSICIAN: Shaune PollackIsaacs, Cameron, MD  CHIEF COMPLAINT:   Chief Complaint  Patient presents with  . Anemia    HISTORY OF PRESENT ILLNESS:  48 y.o. male with pertinent past medical history of hemorrhoids, hypertension, and dyslipidemia presenting to the ED with near syncope and bloody stools.  Patient report has had several months of epigastric pain and was started on Prevacid which he takes as needed.  He noticed black tarry stools last Wednesday and was evaluated in the ED on Friday for symptoms of intermittent lower chest and epigastric pain over the past few months associated with dark stools.  He also endorses weight loss of 5 to 10 pounds over the last few months.  He has been feeling fatigued and lightheaded without syncopal episode.  Denies dysphagia, nausea vomiting, hemoptysis, odynophagia, or any other associated GI symptoms.  Patient denies history of alcohol use or tobacco use.  Patient reported he got clammy and dizzy therefore decided to be evaluated in the ED.  He was noted with hemoglobin of 12.6, stool heme positive, troponin was normal and EKG showed normal sinus rhythm without ischemic changes.  He was started on omeprazole daily and instructed to follow-up with GI.  Patient reports that [facility] who stopped his omeprazole and started him on pantoprazole 40 mg twice daily.  He was scheduled for EGD and colonoscopy for 01/06/2019,  they also noted that his hemoglobin had dropped from 12.6-10.6.  On arrival to the ED, he was afebrile with blood pressure 177/66 mm Hg and pulse rate 112 beats/min. There were no focal neurological deficits;  he was alert and oriented x4.  Initial labs today revealed white count 12.6, hemoglobin 9.4, hematocrit 27.3, potassium 3.2 and bilirubin 1.3. Hospitalist were contacted for admission.  PAST MEDICAL HISTORY:   Past Medical History:  Diagnosis Date  . Hypertension     PAST SURGICAL HISTORY:  History reviewed. No pertinent surgical history.  SOCIAL HISTORY:   Social History   Tobacco Use  . Smoking status: Never Smoker  . Smokeless tobacco: Never Used  Substance Use Topics  . Alcohol use: Not Currently    FAMILY HISTORY:  History reviewed. No pertinent family history.  DRUG ALLERGIES:  No Known Allergies  REVIEW OF SYSTEMS:   Review of Systems  Constitutional: Positive for malaise/fatigue and weight loss. Negative for chills and fever.  HENT: Negative for congestion, hearing loss and sore throat.   Eyes: Negative for blurred vision and double vision.  Respiratory: Negative for cough, shortness of breath and wheezing.   Cardiovascular: Negative for chest pain, palpitations, orthopnea and leg swelling.  Gastrointestinal: Positive for abdominal pain and melena. Negative for diarrhea.  Genitourinary: Negative for dysuria and urgency.  Musculoskeletal: Negative for myalgias.  Skin: Negative for rash.  Neurological: Positive for weakness. Negative for dizziness, sensory change, speech change, focal weakness and headaches.  Psychiatric/Behavioral: Negative for depression.    MEDICATIONS AT HOME:   Prior to Admission medications   Medication Sig Start Date End Date Taking? Authorizing Provider  aspirin 325 MG tablet Take 325 mg by mouth daily.   Yes [provider]  Multiple Vitamin (MULTIVITAMIN) tablet Take 1 tablet by mouth daily.   Yes [provider]  pantoprazole (PROTONIX) 40 MG tablet Take 40 mg by mouth daily.   Yes [provider]  sucralfate (CARAFATE) 1 g tablet Take 1 g by mouth 2 (two) times daily before a meal. 01/04/19 01/04/20 Yes  [provider]  diphenhydrAMINE (BENADRYL) 25 MG tablet Take 25 mg by mouth every 6 (six) hours as needed.    [provider]     VITAL SIGNS:  Blood pressure (!) 163/71, pulse (!) 108, temperature 98.4 F (36.9 C), temperature source Oral, resp. rate 20, height 5\' 6"  (1.676 m), weight 90.7 kg, SpO2 100 %.  PHYSICAL EXAMINATION:   Physical Exam  GENERAL:  48 y.o.-year-old patient lying in the bed with no acute distress.  EYES: Pupils equal, round, reactive to light and accommodation. No scleral icterus. Extraocular muscles intact.  HEENT: Head atraumatic, normocephalic. Oropharynx and nasopharynx clear.  NECK:  Supple, no jugular venous distention. No thyroid enlargement, no tenderness.  LUNGS: Normal breath sounds bilaterally, no wheezing, rales,rhonchi or crepitation. No use of accessory muscles of respiration.  CARDIOVASCULAR: S1, S2 normal. No murmurs, rubs, or gallops.  ABDOMEN: Soft, epigastric tenderness. nondistended. Bowel sounds present. No organomegaly or mass.  EXTREMITIES: No pedal edema, cyanosis, or clubbing. No rash or lesions. + pedal pulses MUSCULOSKELETAL: Normal bulk, and power was 5+ grip and elbow, knee, and ankle flexion and extension bilaterally.  NEUROLOGIC:Alert and oriented x 3. CN 2-12 intact. Sensation to light touch and cold stimuli intact bilaterally. Finger to nose nl. Babinski is downgoing. DTR's (biceps, patellar, and achilles) 2+ and symmetric throughout. Gait not tested due to safety concern. PSYCHIATRIC: The patient is alert and oriented x 3.  SKIN: No obvious rash, lesion, or ulcer.   DATA REVIEWED:  LABORATORY PANEL:   CBC Recent Labs  Lab 01/05/19 1402  WBC 12.6*  HGB 9.4*  HCT 27.3*  PLT 332   ------------------------------------------------------------------------------------------------------------------  Chemistries  Recent Labs  Lab 01/05/19 1402  NA 135  K 3.2*  CL 103  CO2 20*  GLUCOSE 104*  BUN 14   CREATININE 1.20  CALCIUM 8.3*  AST 19  ALT 20  ALKPHOS 42  BILITOT 1.3*   ------------------------------------------------------------------------------------------------------------------  Cardiac Enzymes No results for input(s): TROPONINI in the last 168 hours. ------------------------------------------------------------------------------------------------------------------  RADIOLOGY:  No results found.  EKG:  EKG: normal EKG, normal sinus rhythm, unchanged from previous tracings. Vent. rate 88 BPM PR interval 148 ms QRS duration 98 ms QT/QTc 366/442 ms P-R-T axes 60 33 69 IMPRESSION AND PLAN:   48 y.o. male with pertinent past medical history of hemorrhoids, hypertension, and dyslipidemia presenting to the ED with near syncope and bloody stools.  1.  GI Bleed - Acute onset without evidence of hemodynamic instability. Patient presenting with melena - Peptic Ulcer Disease (H. Pylori vs. NSAID), Gastritis, Variceal Bleed, MW Tear, AVM, less likely malignancy and Lower GI: Diverticulosis, AVM, malignancy, hemorrhoids in the differentials - CTA abdomen/pelvis 01/03/2019 no significant vascular pathology, noted descending and sigmoid diverticulosis - Admit to medsurg unit - IVF resuscitation to maintain MAP>65 - H&H monitoring q6h - Transfuse PRN Hgb<8 - Pantoprazole 80mg  IV x1 then gtt 8mg /hr - NPO for pending endoscopy - Helicobacter pylori Ab + stool Ag.  If either positive, treat - Hold NSAIDs, steroids, ASA - GI Consult for EGD/Colonoscopy  2. Hypokalemia-replete -Recheck in a.m. + magnesium  3.  HLD  + Goal LDL<100 -C heck lipid panel  4. HTN = BP elevated during this admission + Goal BP <140/80 -Not on any home meds -Treat with as needed hydralazine  5. DVT prophylaxis - hold anticoagulation in the setting of GI bleed   All the records are reviewed and case discussed with ED provider. Management plans discussed with the patient, family and they are in  agreement.  CODE STATUS: FULL  TOTAL TIME TAKING CARE OF THIS PATIENT: 50 minutes.     01/05/2019 at 4:17 PM  Rufina Falco, DNP, FNP-BC [facility] Nurse Practitioner Between 7am to 6pm - Pager [phone]  After 6pm go to [email or web address] - password [email or web address] [facility]  Office  [phone]  CC: Primary care physician; Rusty Aus, MD

## 2019-01-05 NOTE — ED Triage Notes (Signed)
Pt arrives via [facility] for anemia, near syncope and bloody stools. Pt seen last week and was dx with anemia. Pt reports coffee ground stool last Wednesday, no vomiting. Pt went to [facility] this morning where labs were done and he was told his hgb was 9.5.

## 2019-01-06 ENCOUNTER — Inpatient Hospital Stay: Payer: BLUE CROSS/BLUE SHIELD | Admitting: Anesthesiology

## 2019-01-06 ENCOUNTER — Encounter
Admission: EM | Disposition: A | Discharge status: Home / Self Care | Payer: Self-pay | Source: Home / Self Care | Attending: Nurse Practitioner

## 2019-01-06 ENCOUNTER — Ambulatory Visit
Admission: RE | Admit: 2019-01-06 | Payer: BLUE CROSS/BLUE SHIELD | Source: Home / Self Care | Admitting: Gastroenterology

## 2019-01-06 ENCOUNTER — Encounter: Payer: Self-pay | Admitting: Anesthesiology

## 2019-01-06 HISTORY — PX: ESOPHAGOGASTRODUODENOSCOPY: SHX5428

## 2019-01-06 HISTORY — DX: Essential (primary) hypertension: I10

## 2019-01-06 LAB — CBC WITH DIFFERENTIAL/PLATELET
Abs Immature Granulocytes: 0.11 10*3/uL — ABNORMAL HIGH (ref 0.00–0.07)
Basophils Absolute: 0.1 10*3/uL (ref 0.0–0.1)
Basophils Relative: 1 %
Eosinophils Absolute: 0.2 10*3/uL (ref 0.0–0.5)
Eosinophils Relative: 2 %
HCT: 27.7 % — ABNORMAL LOW (ref 39.0–52.0)
Hemoglobin: 9.1 g/dL — ABNORMAL LOW (ref 13.0–17.0)
Immature Granulocytes: 1 %
Lymphocytes Relative: 21 %
Lymphs Abs: 2.7 10*3/uL (ref 0.7–4.0)
MCH: 31.2 pg (ref 26.0–34.0)
MCHC: 32.9 g/dL (ref 30.0–36.0)
MCV: 94.9 fL (ref 80.0–100.0)
Monocytes Absolute: 0.8 10*3/uL (ref 0.1–1.0)
Monocytes Relative: 7 %
Neutro Abs: 8.5 10*3/uL — ABNORMAL HIGH (ref 1.7–7.7)
Neutrophils Relative %: 68 %
Platelets: 310 10*3/uL (ref 150–400)
RBC: 2.92 MIL/uL — ABNORMAL LOW (ref 4.22–5.81)
RDW: 14.6 % (ref 11.5–15.5)
WBC: 12.4 10*3/uL — ABNORMAL HIGH (ref 4.0–10.5)
nRBC: 0 % (ref 0.0–0.2)

## 2019-01-06 LAB — MAGNESIUM: Magnesium: 2.3 mg/dL (ref 1.7–2.4)

## 2019-01-06 LAB — BASIC METABOLIC PANEL
Anion gap: 7 (ref 5–15)
BUN: 12 mg/dL (ref 6–20)
CO2: 24 mmol/L (ref 22–32)
Calcium: 7.9 mg/dL — ABNORMAL LOW (ref 8.9–10.3)
Chloride: 108 mmol/L (ref 98–111)
Creatinine, Ser: 1.1 mg/dL (ref 0.61–1.24)
GFR calc Af Amer: >60 mL/min (ref >60)
GFR calc non Af Amer: >60 mL/min (ref >60)
Glucose, Bld: 89 mg/dL (ref 70–99)
Potassium: 3.2 mmol/L — ABNORMAL LOW (ref 3.5–5.1)
Sodium: 139 mmol/L (ref 135–145)

## 2019-01-06 SURGERY — ESOPHAGOGASTRODUODENOSCOPY (EGD) WITH PROPOFOL
Anesthesia: General

## 2019-01-06 SURGERY — EGD (ESOPHAGOGASTRODUODENOSCOPY)
Anesthesia: General

## 2019-01-06 MED ORDER — PANTOPRAZOLE SODIUM 40 MG PO TBEC: 40.0000 mg | DELAYED_RELEASE_TABLET | Freq: Two times a day (BID) | ORAL | 0 refills | Status: DC

## 2019-01-06 MED ORDER — PROPOFOL 500 MG/50ML IV EMUL
INTRAVENOUS | Status: DC | PRN
  Administered 2019-01-06: 120 ug/kg/min via INTRAVENOUS

## 2019-01-06 MED ORDER — FENTANYL CITRATE (PF) 100 MCG/2ML IJ SOLN
INTRAMUSCULAR | Status: AC
  Filled 2019-01-06: qty 2

## 2019-01-06 MED ORDER — POTASSIUM CHLORIDE CRYS ER 20 MEQ PO TBCR
40.0000 meq | EXTENDED_RELEASE_TABLET | Freq: Once | ORAL | Status: AC
  Administered 2019-01-06: 40 meq via ORAL
  Filled 2019-01-06: qty 2

## 2019-01-06 MED ORDER — MIDAZOLAM HCL 2 MG/2ML IJ SOLN
INTRAMUSCULAR | Status: DC | PRN
  Administered 2019-01-06 (×2): 1 mg via INTRAVENOUS

## 2019-01-06 MED ORDER — MIDAZOLAM HCL 2 MG/2ML IJ SOLN
INTRAMUSCULAR | Status: AC
  Filled 2019-01-06: qty 2

## 2019-01-06 MED ORDER — LIDOCAINE HCL (PF) 2 % IJ SOLN
INTRAMUSCULAR | Status: AC
  Filled 2019-01-06: qty 10

## 2019-01-06 MED ORDER — FENTANYL CITRATE (PF) 100 MCG/2ML IJ SOLN
INTRAMUSCULAR | Status: DC | PRN
  Administered 2019-01-06 (×2): 50 ug via INTRAVENOUS

## 2019-01-06 MED ORDER — PROPOFOL 500 MG/50ML IV EMUL
INTRAVENOUS | Status: AC
  Filled 2019-01-06: qty 50

## 2019-01-06 NOTE — Discharge Instructions (Signed)
Avoid NSAIDS and asa like meds

## 2019-01-06 NOTE — Discharge Summary (Signed)
[facility] [place] [facility]   PATIENT NAME: William GuarneriJames Ballard    MR#:  [id number]  DATE OF BIRTH:  06/11/1971  DATE OF ADMISSION:  01/05/2019 ADMITTING PHYSICIAN: Jimmye NormanElizabeth Achieng Ouma, NP  DATE OF DISCHARGE: 01/06/2019  PRIMARY CARE PHYSICIAN: Danella PentonMiller, Mark F, MD    ADMISSION DIAGNOSIS:  Blood loss anemia [D50.0] UGIB (upper gastrointestinal bleed) [K92.2]  DISCHARGE DIAGNOSIS:  G.I. bleed secondary to peptic ulcer disease  SECONDARY DIAGNOSIS:   Past Medical History:  Diagnosis Date  . Hypertension     HOSPITAL COURSE:  48 y.o. male with pertinent past medical history of hemorrhoids, hypertension, and dyslipidemia presenting to the ED with near syncope and bloody stools.  1.  GI Bleed - Acute onset without evidence of hemodynamic instability. Patient presenting with melena -- received Pantoprazole 80mg  IV x1 then gtt 8mg /hr - s/p EGD- Normal esophagus.                       - Gastritis. Biopsied.                       - Non-bleeding duodenal ulcer with no stigmata of                        bleeding. -Appreciate Dr. Horace Porteousoledo's input. Will start patient on soft diet. If continues to tolerate discharge home later. -Protonix 40 mg BID, Carafate - Helicobacter pylori Ab + stool Ag. F/u with Dr Marva Pandaskulskie as out pt - Hold NSAIDs, steroids, ASA -Hgb 9.1  2. Hypokalemia-replete today  3.  HLD  + Goal LDL<100  4. HTN = BP elevated during this admission + Goal BP <140/80 -Not on any home meds -Treat with as needed hydralazine -defer to primary care physician for treatment if needed as outpatient. Last blood pressure is 131/54  5. DVT prophylaxis - SCD  discharge to home with outpatient G.I. follow-up with Dr. Marva PandaSkulskie CONSULTS OBTAINED:  Treatment Team:  Stanton Kidneyoledo, Teodoro K, MD  DRUG ALLERGIES:   Allergies  Allergen Reactions  . Tylenol [Acetaminophen] Palpitations    DISCHARGE MEDICATIONS:   Allergies as of 01/06/2019    Reactions   Tylenol [acetaminophen] Palpitations      Medication List    STOP taking these medications   aspirin 325 MG tablet     TAKE these medications   diphenhydrAMINE 25 MG tablet Commonly known as: BENADRYL Take 25 mg by mouth every 6 (six) hours as needed.   multivitamin tablet Take 1 tablet by mouth daily.   pantoprazole 40 MG tablet Commonly known as: PROTONIX Take 1 tablet (40 mg total) by mouth 2 (two) times daily before a meal. What changed: when to take this   sucralfate 1 g tablet Commonly known as: CARAFATE Take 1 g by mouth 2 (two) times daily before a meal.       If you experience worsening of your admission symptoms, develop shortness of breath, life threatening emergency, suicidal or homicidal thoughts you must seek medical attention immediately by calling 911 or calling your MD immediately  if symptoms less severe.  You Must read complete instructions/literature along with all the possible adverse reactions/side effects for all the Medicines you take and that have been prescribed to you. Take any new Medicines after you have completely understood and accept all the possible adverse reactions/side effects.   Please note  You were cared for by a hospitalist during your  hospital stay. If you have any questions about your discharge medications or the care you received while you were in the hospital after you are discharged, you can call the unit and asked to speak with the hospitalist on call if the hospitalist that took care of you is not available. Once you are discharged, your primary care physician will handle any further medical issues. Please note that NO REFILLS for any discharge medications will be authorized once you are discharged, as it is imperative that you return to your primary care physician (or establish a relationship with a primary care physician if you do not have one) for your aftercare needs so that they can reassess your need for medications  and monitor your lab values. Today   SUBJECTIVE   No new complaints mild GERD  VITAL SIGNS:  Blood pressure (!) 131/54, pulse 95, temperature 98.9 F (37.2 C), temperature source Oral, resp. rate 18, height 5\' 6"  (1.676 m), weight 90.7 kg, SpO2 100 %.  I/O:    Intake/Output Summary (Last 24 hours) at 01/06/2019 1407 Last data filed at 01/06/2019 1241 Gross per 24 hour  Intake 1773.75 ml  Output 200 ml  Net 1573.75 ml    PHYSICAL EXAMINATION:  GENERAL:  48 y.o.-year-old patient lying in the bed with no acute distress.  EYES: Pupils equal, round, reactive to light and accommodation. No scleral icterus. Extraocular muscles intact.  HEENT: Head atraumatic, normocephalic. Oropharynx and nasopharynx clear.  NECK:  Supple, no jugular venous distention. No thyroid enlargement, no tenderness.  LUNGS: Normal breath sounds bilaterally, no wheezing, rales,rhonchi or crepitation. No use of accessory muscles of respiration.  CARDIOVASCULAR: S1, S2 normal. No murmurs, rubs, or gallops.  ABDOMEN: Soft, non-tender, non-distended. Bowel sounds present. No organomegaly or mass.  EXTREMITIES: No pedal edema, cyanosis, or clubbing.  NEUROLOGIC: Cranial nerves II through XII are intact. Muscle strength 5/5 in all extremities. Sensation intact. Gait not checked.  PSYCHIATRIC: The patient is alert and oriented x 3.  SKIN: No obvious rash, lesion, or ulcer.   DATA REVIEW:   CBC  Recent Labs  Lab 01/06/19 0532  WBC 12.4*  HGB 9.1*  HCT 27.7*  PLT 310    Chemistries  Recent Labs  Lab 01/05/19 1402 01/06/19 0532  NA 135 139  K 3.2* 3.2*  CL 103 108  CO2 20* 24  GLUCOSE 104* 89  BUN 14 12  CREATININE 1.20 1.10  CALCIUM 8.3* 7.9*  MG  --  2.3  AST 19  --   ALT 20  --   ALKPHOS 42  --   BILITOT 1.3*  --     Microbiology Results   Recent Results (from the past 240 hour(s))  SARS Coronavirus 2 (Performed in [place] hospital lab)     Status: None   Collection Time: 01/04/19  10:45 AM   Specimen: Nasal Swab  Result Value Ref Range Status   SARS Coronavirus 2 NEGATIVE NEGATIVE Final    Comment: (NOTE) SARS-CoV-2 target nucleic acids are NOT DETECTED. The SARS-CoV-2 RNA is generally detectable in upper and lower respiratory specimens during the acute phase of infection. Negative results do not preclude SARS-CoV-2 infection, do not rule out co-infections with other pathogens, and should not be used as the sole basis for treatment or other patient management decisions. Negative results must be combined with clinical observations, patient history, and epidemiological information. The expected result is Negative. Fact Sheet for Patients: SugarRoll.be Fact Sheet for Healthcare Providers: https://www.woods-mathews.com/ This test is not  yet approved or cleared by the [place] FDA and  has been authorized for detection and/or diagnosis of SARS-CoV-2 by FDA under an Emergency Use Authorization (EUA). This EUA will remain  in effect (meaning this test can be used) for the duration of the COVID-19 declaration under Section 56 4(b)(1) of the Act, 21 U.S.C. section 360bbb-3(b)(1), unless the authorization is terminated or revoked sooner. Performed at [facility], [address] [place], [place] [address]     RADIOLOGY:  No results found.   CODE STATUS:     Code Status Orders  (From admission, onward)         Start     Ordered   01/05/19 1606  Full code  Continuous     01/05/19 1610        Code Status History    This patient has a current code status but no historical code status.   Advance Care Planning Activity      TOTAL TIME TAKING CARE OF THIS PATIENT: *40* minutes.    Enedina FinnerSona Daren Yeagle M.D on 01/06/2019 at 2:07 PM  Between 7am to 6pm - Pager - [phone] After 6pm go to [email or web address] - Social research officer, [email or web address] [email or web address]  [facility] [place] [facility]  Office  [phone]  CC: Primary care physician;  Danella PentonMiller, Mark F, MD

## 2019-01-06 NOTE — Anesthesia Procedure Notes (Signed)
Performed by: Cook-Martin, Zyron Deeley Pre-anesthesia Checklist: Emergency Drugs available, Patient identified, Suction available, Patient being monitored and Timeout performed Patient Re-evaluated:Patient Re-evaluated prior to induction Oxygen Delivery Method: Nasal cannula Preoxygenation: Pre-oxygenation with 100% oxygen Induction Type: IV induction Airway Equipment and Method: Bite block Placement Confirmation: CO2 detector and positive ETCO2       

## 2019-01-06 NOTE — Anesthesia Postprocedure Evaluation (Signed)
Anesthesia Post Note  Patient: William Ballard  Procedure(s) Performed: ESOPHAGOGASTRODUODENOSCOPY (EGD) (N/A )  Patient location during evaluation: Endoscopy Anesthesia Type: General Level of consciousness: awake and alert and oriented Pain management: pain level controlled Vital Signs Assessment: post-procedure vital signs reviewed and stable Respiratory status: spontaneous breathing Cardiovascular status: blood pressure returned to baseline Anesthetic complications: no     Last Vitals:  Vitals:   01/06/19 1057 01/06/19 1249  BP: (!) 149/62 (!) 132/56  Pulse: (!) 113 (!) 104  Resp: 20 (!) 21  Temp: (!) 36.3 C 36.4 C  SpO2: 100% 99%    Last Pain:  Vitals:   01/06/19 1249  TempSrc: Oral  PainSc: 0-No pain                 Gennifer Potenza

## 2019-01-06 NOTE — Op Note (Signed)
[facility] Gastroenterology Patient Name: William GuarneriJames Zinda Procedure Date: 01/06/2019 12:09 PM MRN: [id number] Account #: [id number] Date of Birth: 11/16/1970 Admit Type: Inpatient Age: 48 [facility] Gender: Male Note Status: Finalized Procedure:            Upper GI endoscopy Indications:          Epigastric abdominal pain, Melena Providers:            Boykin Nearingeodoro K.  MD, MD Medicines:            Propofol per Anesthesia Complications:        No immediate complications. Procedure:            Pre-Anesthesia Assessment:                       - The risks and benefits of the procedure and the                        sedation options and risks were discussed with the                        patient. All questions were answered and informed                        consent was obtained.                       - Patient identification and proposed procedure were                        verified prior to the procedure by the nurse. The                        procedure was verified in the procedure room.                       - After reviewing the risks and benefits, the patient                        was deemed in satisfactory condition to undergo the                        procedure.                       - ASA Grade Assessment: II - A patient with mild                        systemic disease.                       After obtaining informed consent, the endoscope was                        passed under direct vision. Throughout the procedure,                        the patient's blood pressure, pulse, and oxygen                        saturations were monitored continuously. The Endoscope  was introduced through the mouth, and advanced to the                        third part of duodenum. The upper GI endoscopy was                        accomplished without difficulty. The patient tolerated                        the procedure well. Findings:       The examined esophagus was normal.      Localized mild inflammation characterized by erosions and erythema was       found in the gastric antrum. Biopsies were taken with a cold forceps for       Helicobacter pylori testing.      The cardia and gastric fundus were normal on retroflexion.      One non-bleeding cratered duodenal ulcer with no stigmata of bleeding       was found in the first portion of the duodenum. The lesion was 9 mm in       largest dimension.      The exam was otherwise without abnormality. Impression:           - Normal esophagus.                       - Gastritis. Biopsied.                       - Non-bleeding duodenal ulcer with no stigmata of                        bleeding.                       - The examination was otherwise normal. Recommendation:       - Return patient to hospital ward for possible                        discharge same day.                       - Advance diet as tolerated.                       - The findings and recommendations were discussed with                        the patient.                       - The findings and recommendations were discussed with                        the patient's primary physician. Procedure Code(s):    --- Professional ---                       [phone], Esophagogastroduodenoscopy, flexible, transoral;                        with biopsy, single or multiple Diagnosis Code(s):    --- Professional ---  K92.1, Melena (includes Hematochezia)                       R10.13, Epigastric pain                       K26.9, Duodenal ulcer, unspecified as acute or chronic,                        without hemorrhage or perforation                       K29.70, Gastritis, unspecified, without bleeding CPT copyright 2019 American Medical Association. All rights reserved. The codes documented in this report are preliminary and upon coder review may  be revised to meet current compliance  requirements. Stanton Kidneyeodoro K  MD, MD 01/06/2019 12:43:56 PM This report has been signed electronically. Number of Addenda: 0 Note Initiated On: 01/06/2019 12:09 PM Estimated Blood Loss: Estimated blood loss: none.      [facility]

## 2019-01-06 NOTE — Progress Notes (Signed)
Pt discharged per MD order. IV removed. Discharge instructions reviewed with pt. Pt verbalized understanding of instructions. Pt taken to car in wheelchair.

## 2019-01-06 NOTE — Anesthesia Post-op Follow-up Note (Signed)
Anesthesia QCDR form completed.        

## 2019-01-06 NOTE — Anesthesia Preprocedure Evaluation (Addendum)
Anesthesia Evaluation  Patient identified by MRN, date of birth, ID band Patient awake    Reviewed: Allergy & Precautions, NPO status , Patient's Chart, lab work & pertinent test results  Airway Mallampati: III  TM Distance: <3 FB     Dental  (+) Chipped, Caps   Pulmonary neg pulmonary ROS,    Pulmonary exam normal        Cardiovascular hypertension, Normal cardiovascular exam     Neuro/Psych negative neurological ROS  negative psych ROS   GI/Hepatic Neg liver ROS, GI bleeding   Endo/Other  negative endocrine ROS  Renal/GU negative Renal ROS  negative genitourinary   Musculoskeletal negative musculoskeletal ROS (+)   Abdominal Normal abdominal exam  (+)   Peds negative pediatric ROS (+)  Hematology negative hematology ROS (+)   Anesthesia Other Findings   Reproductive/Obstetrics                            Anesthesia Physical Anesthesia Plan  ASA: II  Anesthesia Plan: General   Post-op Pain Management:    Induction: Intravenous  PONV Risk Score and Plan:   Airway Management Planned: Nasal Cannula  Additional Equipment:   Intra-op Plan:   Post-operative Plan:   Informed Consent: I have reviewed the patients History and Physical, chart, labs and discussed the procedure including the risks, benefits and alternatives for the proposed anesthesia with the patient or authorized representative who has indicated his/her understanding and acceptance.     Dental advisory given  Plan Discussed with: CRNA and Surgeon  Anesthesia Plan Comments:         Anesthesia Quick Evaluation

## 2019-01-06 NOTE — Transfer of Care (Signed)
Immediate Anesthesia Transfer of Care Note  Patient: William Ballard  Procedure(s) Performed: ESOPHAGOGASTRODUODENOSCOPY (EGD) (N/A )  Patient Location: PACU  Anesthesia Type:General  Level of Consciousness: awake and sedated  Airway & Oxygen Therapy: Patient Spontanous Breathing and Patient connected to nasal cannula oxygen  Post-op Assessment: Report given to RN and Post -op Vital signs reviewed and stable  Post vital signs: Reviewed  Last Vitals:  Vitals Value Taken Time  BP    Temp    Pulse    Resp    SpO2      Last Pain:  Vitals:   01/06/19 1057  TempSrc: Tympanic  PainSc:          Complications: No apparent anesthesia complications

## 2019-01-07 ENCOUNTER — Encounter: Payer: Self-pay | Admitting: Internal Medicine

## 2019-01-07 LAB — HIV ANTIBODY (ROUTINE TESTING W REFLEX): HIV Screen 4th Generation wRfx: NONREACTIVE

## 2019-01-11 ENCOUNTER — Encounter: Payer: Self-pay | Admitting: Internal Medicine

## 2019-01-11 LAB — SURGICAL PATHOLOGY

## 2019-01-21 ENCOUNTER — Other Ambulatory Visit: Payer: BLUE CROSS/BLUE SHIELD | Attending: Gastroenterology

## 2019-01-25 ENCOUNTER — Ambulatory Visit
Admission: RE | Admit: 2019-01-25 | Payer: BLUE CROSS/BLUE SHIELD | Source: Home / Self Care | Admitting: Gastroenterology

## 2019-01-25 ENCOUNTER — Encounter: Admission: RE | Payer: Self-pay | Source: Home / Self Care

## 2019-01-25 SURGERY — ESOPHAGOGASTRODUODENOSCOPY (EGD) WITH PROPOFOL
Anesthesia: General

## 2019-02-25 ENCOUNTER — Other Ambulatory Visit
Admission: RE | Admit: 2019-02-25 | Discharge: 2019-02-25 | Disposition: A | Discharge status: Home / Self Care | Payer: BLUE CROSS/BLUE SHIELD | Source: Ambulatory Visit | Attending: Gastroenterology | Admitting: Gastroenterology

## 2019-02-25 ENCOUNTER — Other Ambulatory Visit: Payer: Self-pay

## 2019-02-25 DIAGNOSIS — Z01812 Encounter for preprocedural laboratory examination: Secondary | ICD-10-CM | POA: Insufficient documentation

## 2019-02-25 DIAGNOSIS — Z20828 Contact with and (suspected) exposure to other viral communicable diseases: Secondary | ICD-10-CM | POA: Diagnosis not present

## 2019-02-26 LAB — SARS CORONAVIRUS 2 (TAT 6-24 HRS): SARS Coronavirus 2: NEGATIVE

## 2019-02-28 ENCOUNTER — Encounter: Payer: Self-pay | Admitting: *Deleted

## 2019-03-01 ENCOUNTER — Ambulatory Visit: Payer: BLUE CROSS/BLUE SHIELD | Admitting: Anesthesiology

## 2019-03-01 ENCOUNTER — Encounter
Admission: RE | Disposition: A | Discharge status: Home / Self Care | Payer: Self-pay | Source: Home / Self Care | Attending: Gastroenterology

## 2019-03-01 ENCOUNTER — Encounter: Payer: Self-pay | Admitting: *Deleted

## 2019-03-01 ENCOUNTER — Ambulatory Visit
Admission: RE | Admit: 2019-03-01 | Discharge: 2019-03-01 | Disposition: A | Discharge status: Home / Self Care | Payer: BLUE CROSS/BLUE SHIELD | Attending: Gastroenterology | Admitting: Gastroenterology

## 2019-03-01 DIAGNOSIS — I10 Essential (primary) hypertension: Secondary | ICD-10-CM | POA: Insufficient documentation

## 2019-03-01 DIAGNOSIS — K640 First degree hemorrhoids: Secondary | ICD-10-CM | POA: Diagnosis not present

## 2019-03-01 DIAGNOSIS — K5730 Diverticulosis of large intestine without perforation or abscess without bleeding: Secondary | ICD-10-CM | POA: Insufficient documentation

## 2019-03-01 DIAGNOSIS — Z886 Allergy status to analgesic agent status: Secondary | ICD-10-CM | POA: Diagnosis not present

## 2019-03-01 DIAGNOSIS — K2950 Unspecified chronic gastritis without bleeding: Secondary | ICD-10-CM | POA: Diagnosis not present

## 2019-03-01 DIAGNOSIS — Z79899 Other long term (current) drug therapy: Secondary | ICD-10-CM | POA: Insufficient documentation

## 2019-03-01 DIAGNOSIS — K269 Duodenal ulcer, unspecified as acute or chronic, without hemorrhage or perforation: Secondary | ICD-10-CM | POA: Insufficient documentation

## 2019-03-01 DIAGNOSIS — K621 Rectal polyp: Secondary | ICD-10-CM | POA: Diagnosis not present

## 2019-03-01 DIAGNOSIS — Z1211 Encounter for screening for malignant neoplasm of colon: Secondary | ICD-10-CM | POA: Insufficient documentation

## 2019-03-01 DIAGNOSIS — K228 Other specified diseases of esophagus: Secondary | ICD-10-CM | POA: Insufficient documentation

## 2019-03-01 HISTORY — PX: COLONOSCOPY WITH PROPOFOL: SHX5780

## 2019-03-01 HISTORY — PX: ESOPHAGOGASTRODUODENOSCOPY (EGD) WITH PROPOFOL: SHX5813

## 2019-03-01 HISTORY — DX: Unspecified hemorrhoids: K64.9

## 2019-03-01 HISTORY — DX: Other specified abnormal findings of blood chemistry: R79.89

## 2019-03-01 SURGERY — ESOPHAGOGASTRODUODENOSCOPY (EGD) WITH PROPOFOL
Anesthesia: General

## 2019-03-01 MED ORDER — PHENYLEPHRINE HCL (PRESSORS) 10 MG/ML IV SOLN
INTRAVENOUS | Status: DC | PRN
  Administered 2019-03-01 (×2): 100 ug via INTRAVENOUS

## 2019-03-01 MED ORDER — MIDAZOLAM HCL 2 MG/2ML IJ SOLN
INTRAMUSCULAR | Status: DC | PRN
  Administered 2019-03-01: 2 mg via INTRAVENOUS

## 2019-03-01 MED ORDER — MIDAZOLAM HCL 2 MG/2ML IJ SOLN
INTRAMUSCULAR | Status: AC
  Filled 2019-03-01: qty 2

## 2019-03-01 MED ORDER — SODIUM CHLORIDE 0.9 % IV SOLN
INTRAVENOUS | Status: DC
  Administered 2019-03-01: 11:00:00 via INTRAVENOUS

## 2019-03-01 MED ORDER — PROPOFOL 500 MG/50ML IV EMUL
INTRAVENOUS | Status: DC | PRN
  Administered 2019-03-01: 100 ug/kg/min via INTRAVENOUS

## 2019-03-01 MED ORDER — SODIUM CHLORIDE 0.9 % IV SOLN
INTRAVENOUS | Status: DC
  Administered 2019-03-01: 1000 mL via INTRAVENOUS

## 2019-03-01 MED ORDER — LIDOCAINE HCL (CARDIAC) PF 100 MG/5ML IV SOSY
PREFILLED_SYRINGE | INTRAVENOUS | Status: DC | PRN
  Administered 2019-03-01: 100 mg via INTRAVENOUS

## 2019-03-01 MED ORDER — PROPOFOL 10 MG/ML IV BOLUS
INTRAVENOUS | Status: DC | PRN
  Administered 2019-03-01: 70 mg via INTRAVENOUS
  Administered 2019-03-01: 20 mg via INTRAVENOUS
  Administered 2019-03-01: 30 mg via INTRAVENOUS

## 2019-03-01 NOTE — Anesthesia Preprocedure Evaluation (Signed)
Anesthesia Evaluation  Patient identified by MRN, date of birth, ID band Patient awake    Reviewed: Allergy & Precautions, H&P , NPO status , Patient's Chart, lab work & pertinent test results, reviewed documented beta blocker date and time   Airway Mallampati: II   Neck ROM: full    Dental  (+) Poor Dentition   Pulmonary neg pulmonary ROS,    Pulmonary exam normal        Cardiovascular Exercise Tolerance: Good hypertension, On Medications negative cardio ROS Normal cardiovascular exam Rhythm:regular Rate:Normal     Neuro/Psych negative neurological ROS  negative psych ROS   GI/Hepatic negative GI ROS, Neg liver ROS,   Endo/Other  negative endocrine ROS  Renal/GU negative Renal ROS  negative genitourinary   Musculoskeletal   Abdominal   Peds  Hematology negative hematology ROS (+)   Anesthesia Other Findings Past Medical History: No date: Hemorrhoids No date: Hypertension No date: 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 Past Surgical History: 01/06/2019: ESOPHAGOGASTRODUODENOSCOPY; N/A     Comment:  Procedure: ESOPHAGOGASTRODUODENOSCOPY (EGD);  Surgeon:               Toledo, Benay Pike, MD;  Location: [facility] ENDOSCOPY;                Service: Gastroenterology;  Laterality: N/A; BMI    Body Mass Index: 28.25 kg/m     Reproductive/Obstetrics negative OB ROS                             Anesthesia Physical Anesthesia Plan  ASA: II  Anesthesia Plan: General   Post-op Pain Management:    Induction:   PONV Risk Score and Plan:   Airway Management Planned:   Additional Equipment:   Intra-op Plan:   Post-operative Plan:   Informed Consent: I have reviewed the patients History and Physical, chart, labs and discussed the procedure including the risks, benefits and alternatives for the proposed anesthesia with the patient or authorized representative who has indicated his/her  understanding and acceptance.     Dental Advisory Given  Plan Discussed with: CRNA  Anesthesia Plan Comments:         Anesthesia Quick Evaluation

## 2019-03-01 NOTE — Op Note (Signed)
[facility] Gastroenterology Patient Name: William Ballard Procedure Date: 03/01/2019 10:58 AM MRN: [id number] Account #: [id number] Date of Birth: Jan 15, 1971 Admit Type: Outpatient Age: 48 Room: [facility] ENDO ROOM 3 Gender: Male Note Status: Finalized Procedure:            Upper GI endoscopy Indications:          Follow-up of acute duodenal ulcer with hemorrhage Providers:            Lollie Sails, MD Medicines:            Monitored Anesthesia Care Complications:        No immediate complications. Procedure:            Pre-Anesthesia Assessment:                       - ASA Grade Assessment: II - A patient with mild                        systemic disease.                       After obtaining informed consent, the endoscope was                        passed under direct vision. Throughout the procedure,                        the patient's blood pressure, pulse, and oxygen                        saturations were monitored continuously. The Endoscope                        was introduced through the mouth, and advanced to the                        third part of duodenum. The upper GI endoscopy was                        accomplished without difficulty. Findings:      The Z-line was variable. Biopsies were taken with a cold forceps for       histology.      The exam of the esophagus was otherwise normal.      Diffuse minimal inflammation characterized by congestion (edema) was       found in the gastric antrum. Biopsies were taken with a cold forceps for       histology from antrum and body. Biopsies were taken with a cold forceps       for Helicobacter pylori testing from antrum and body.      The cardia and gastric fundus were normal on retroflexion.      One non-bleeding superficial duodenal ulcer, residual from previous       healing, with no stigmata of bleeding was found in the duodenal bulb.       The lesion was 1-2 mm in largest dimension.      The exam of  the duodenum was otherwise normal. Impression:           - Z-line variable. Biopsied.                       -  Gastritis. Biopsied.                       - Non-bleeding duodenal ulcer, imporved/healing, with                        no stigmata of bleeding. Recommendation:       - Discharge patient to home.                       - Use Protonix (pantoprazole) 40 mg PO BID for 3 weeks.                       - Use Protonix (pantoprazole) 40 mg PO daily daily.                       - No aspirin, ibuprofen, naproxen, or other                        non-steroidal anti-inflammatory drugs. Procedure Code(s):    --- Professional ---                       [phone], Esophagogastroduodenoscopy, flexible, transoral;                        with biopsy, single or multiple Diagnosis Code(s):    --- Professional ---                       K22.8, Other specified diseases of esophagus                       K29.70, Gastritis, unspecified, without bleeding                       K26.9, Duodenal ulcer, unspecified as acute or chronic,                        without hemorrhage or perforation                       K26.0, Acute duodenal ulcer with hemorrhage CPT copyright 2019 American Medical Association. All rights reserved. The codes documented in this report are preliminary and upon coder review may  be revised to meet current compliance requirements. Christena DeemMartin U , MD 03/01/2019 11:23:14 AM This report has been signed electronically. Number of Addenda: 0 Note Initiated On: 03/01/2019 10:58 AM      [facility]

## 2019-03-01 NOTE — H&P (Addendum)
Outpatient short stay form Pre-procedure 03/01/2019 10:52 AM Lollie Sails MD  Primary Physician: Dr. Emily Filbert  Reason for visit: EGD and colonoscopy  History of present illness: Patient is a 48 year old male presenting today for an EGD and colonoscopy.  He has a history of a duodenal ulcer seen on EGD for follow-up of hospitalization for melena on 01/06/2019.  That time he was taking aspirin.  Currently he denies taking any further aspirin products or NSAIDs.  He has had no previous colonoscopy so he is also presenting today for colonoscopy.  He states he has been continuing on Protonix 40mg  twice a day and Carafate although he has not taken the Carafate for several days now.  He had a H. pylori test done on his initial EGD which was negative.  He is seen no further black stools over the past several weeks.    Current Facility-Administered Medications:  .  0.9 %  sodium chloride infusion, , Intravenous, Continuous, Lollie Sails, MD .  0.9 %  sodium chloride infusion, , Intravenous, Continuous, Lollie Sails, MD  Medications Prior to Admission  Medication Sig Dispense Refill Last Dose  . Multiple Vitamin (MULTIVITAMIN) tablet Take 1 tablet by mouth daily.   Past Week at Unknown time  . pantoprazole (PROTONIX) 40 MG tablet Take 1 tablet (40 mg total) by mouth 2 (two) times daily before a meal. 30 tablet 0 02/28/2019 at Unknown time  . sucralfate (CARAFATE) 1 g tablet Take 1 g by mouth 2 (two) times daily before a meal.   Past Week at Unknown time  . diphenhydrAMINE (BENADRYL) 25 MG tablet Take 25 mg by mouth every 6 (six) hours as needed.   Not Taking at Unknown time     Allergies  Allergen Reactions  . Tylenol [Acetaminophen] Palpitations     Past Medical History:  Diagnosis Date  . Hemorrhoids   . Hypertension   . 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    Planned proceedures: EGD, colonoscopy and indicated procedures. I have discussed the risks benefits and complications of procedures to include not limited to bleeding, infection, perforation and the risk of sedation and the patient wishes to proceed.    Lollie Sails, MD Gastroenterology 03/01/2019  10:52 AM

## 2019-03-01 NOTE — Anesthesia Post-op Follow-up Note (Signed)
Anesthesia QCDR form completed.        

## 2019-03-01 NOTE — Transfer of Care (Signed)
Immediate Anesthesia Transfer of Care Note  Patient: William Ballard  Procedure(s) Performed: ESOPHAGOGASTRODUODENOSCOPY (EGD) WITH PROPOFOL (N/A ) COLONOSCOPY WITH PROPOFOL (N/A )  Patient Location: PACU and Endoscopy Unit  Anesthesia Type:General  Level of Consciousness: sedated, drowsy and patient cooperative  Airway & Oxygen Therapy: Patient Spontanous Breathing  Post-op Assessment: Report given to RN and Post -op Vital signs reviewed and stable  Post vital signs: Reviewed and stable  Last Vitals:  Vitals Value Taken Time  BP 104/48 03/01/19 1145  Temp 36.6 C 03/01/19 1145  Pulse 66 03/01/19 1149  Resp 16 03/01/19 1149  SpO2 98 % 03/01/19 1149  Vitals shown include unvalidated device data.  Last Pain:  Vitals:   03/01/19 1145  TempSrc: Tympanic  PainSc:          Complications: No apparent anesthesia complications

## 2019-03-01 NOTE — Op Note (Signed)
[facility] Gastroenterology Patient Name: Marnee GuarneriJames Layton Procedure Date: 03/01/2019 10:58 AM MRN: [id number] Account #: [id number] Date of Birth: 03/07/1971 Admit Type: Outpatient Age: [age] [facility] 3 Gender: Male Note Status: Finalized Procedure:            Colonoscopy Indications:          Screening for colorectal malignant neoplasm, This is                        the patient's first colonoscopy Providers:            Christena DeemMartin U. Tamiya Colello, MD Medicines:            Monitored Anesthesia Care Complications:        No immediate complications. Procedure:            Pre-Anesthesia Assessment:                       - ASA Grade Assessment: II - A patient with mild                        systemic disease.                       After obtaining informed consent, the colonoscope was                        passed under direct vision. Throughout the procedure,                        the patient's blood pressure, pulse, and oxygen                        saturations were monitored continuously. The                        Colonoscope was introduced through the anus and                        advanced to the the cecum, identified by appendiceal                        orifice and ileocecal valve. The colonoscopy was                        performed without difficulty. The patient tolerated the                        procedure well. The quality of the bowel preparation                        was good. Findings:      A few small-mouthed diverticula were found in the sigmoid colon.      A 2 mm polyp was found in the rectum. The polyp was sessile. The polyp       was removed with a cold biopsy forceps. Resection and retrieval were       complete.      Non-bleeding internal hemorrhoids were found during retroflexion. The       hemorrhoids were small and Grade I (internal hemorrhoids that do not       prolapse).  No additional abnormalities were found on  retroflexion. Impression:           - Diverticulosis in the sigmoid colon.                       - One 2 mm polyp in the rectum, removed with a cold                        biopsy forceps. Resected and retrieved.                       - Non-bleeding internal hemorrhoids. Recommendation:       - Discharge patient to home.                       - Await pathology results.                       - Telephone GI clinic for pathology results in 5 days. Procedure Code(s):    --- Professional ---                       [phone], Colonoscopy, flexible; with biopsy, single or                        multiple Diagnosis Code(s):    --- Professional ---                       Z12.11, Encounter for screening for malignant neoplasm                        of colon                       K64.0, First degree hemorrhoids                       K62.1, Rectal polyp                       K57.30, Diverticulosis of large intestine without                        perforation or abscess without bleeding CPT copyright 2019 American Medical Association. All rights reserved. The codes documented in this report are preliminary and upon coder review may  be revised to meet current compliance requirements. Lollie Sails, MD 03/01/2019 11:43:41 AM This report has been signed electronically. Number of Addenda: 0 Note Initiated On: 03/01/2019 10:58 AM Scope Withdrawal Time: 0 hours 8 minutes 36 seconds  Total Procedure Duration: 0 hours 14 minutes 7 seconds       [facility]

## 2019-03-02 ENCOUNTER — Encounter: Payer: Self-pay | Admitting: Gastroenterology

## 2019-03-02 LAB — SURGICAL PATHOLOGY

## 2019-03-08 NOTE — Anesthesia Postprocedure Evaluation (Signed)
Anesthesia Post Note  Patient: William Ballard  Procedure(s) Performed: ESOPHAGOGASTRODUODENOSCOPY (EGD) WITH PROPOFOL (N/A ) COLONOSCOPY WITH PROPOFOL (N/A )  Patient location during evaluation: PACU Anesthesia Type: General Level of consciousness: awake and alert Pain management: pain level controlled Vital Signs Assessment: post-procedure vital signs reviewed and stable Respiratory status: spontaneous breathing, nonlabored ventilation, respiratory function stable and patient connected to nasal cannula oxygen Cardiovascular status: blood pressure returned to baseline and stable Postop Assessment: no apparent nausea or vomiting Anesthetic complications: no     Last Vitals:  Vitals:   03/01/19 1029 03/01/19 1145  BP: 124/66 (!) 104/48  Pulse: 79   Resp: 18   Temp: 36.7 C 36.6 C  SpO2: 100%     Last Pain:  Vitals:   03/02/19 0727  TempSrc:   PainSc: 0-No pain                 Molli Barrows

## 2019-08-04 IMAGING — CT CT ANGIOGRAPHY ABDOMEN AND PELVIS WITH CONTRAST AND WITHOUT CONT
3 of 10 series · 11 of 46 positions shown, 17 images · IV contrast (iopamidol)
Comparison: None.

CLINICAL DATA: Black stools x5 days, upper abdominal pain

EXAM:
CTA ABDOMEN AND PELVIS WITH CONTRAST
TECHNIQUE: Multidetector CT imaging of the abdomen and pelvis was performed
using the standard protocol during bolus administration of
intravenous contrast. Multiplanar reconstructed images and MIPs were
obtained and reviewed to evaluate the vascular anatomy.
CONTRAST:  100mL 2EYBX7-IEQ IOPAMIDOL (2EYBX7-IEQ) INJECTION 76%

[Series 5: axial arterial · axial · arterial · 0.84mm/px · z∈[-494,-246]mm · 5 of 248 slices shown]
[im 18/248  soft-tissue]
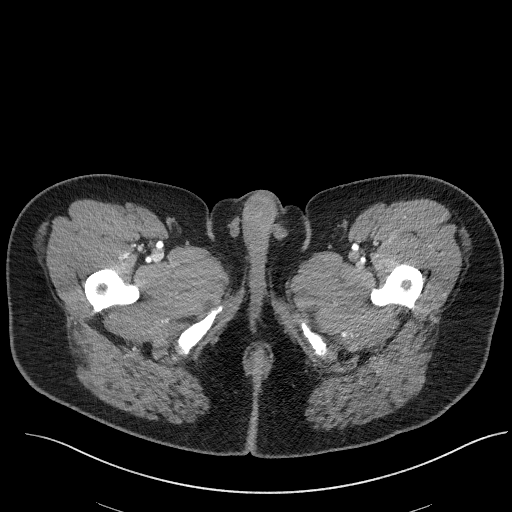
[im 53/248  soft-tissue]
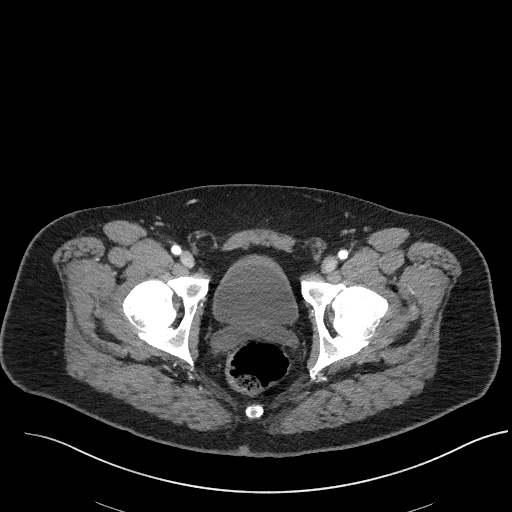
[im 89/248  soft-tissue]
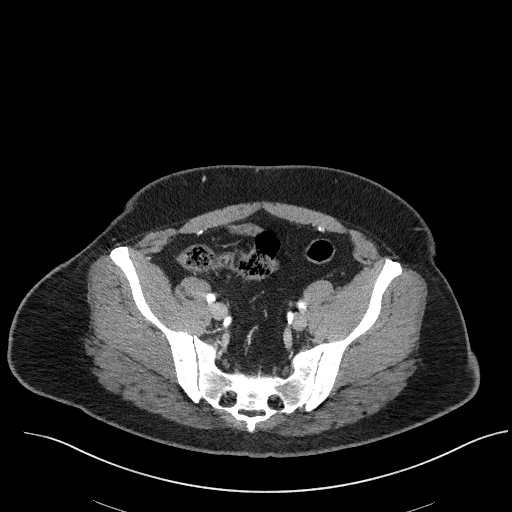
[im 106/248  soft-tissue]
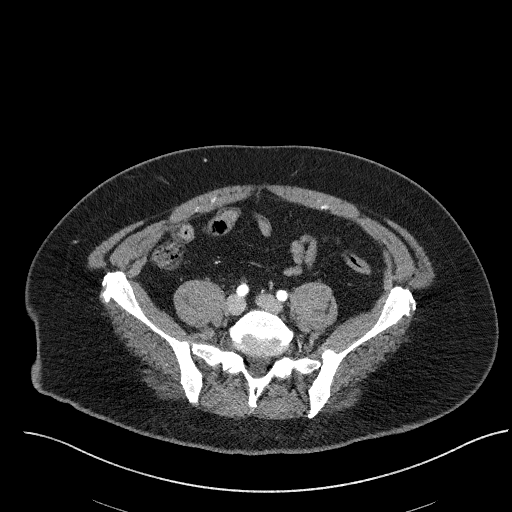
[im 142/248  soft-tissue]
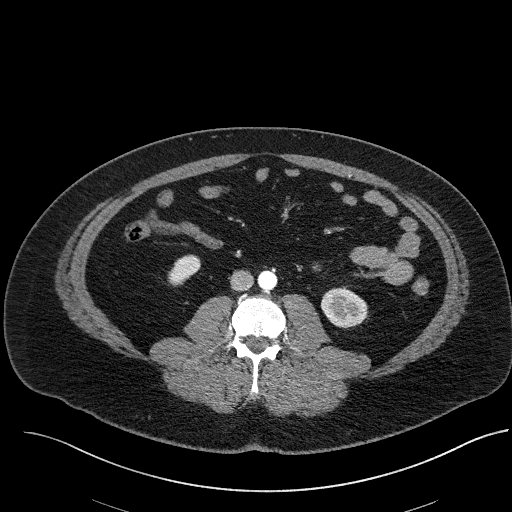

[Series 6: axial venous · axial · portal-venous · 0.84mm/px · z∈[-414,-129]mm · 4 of 95 slices shown, 9 images]
[im 19/95  soft-tissue]
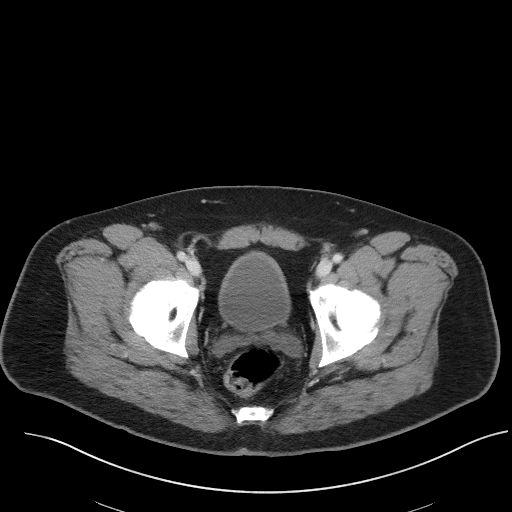
[im 19/95  lung]
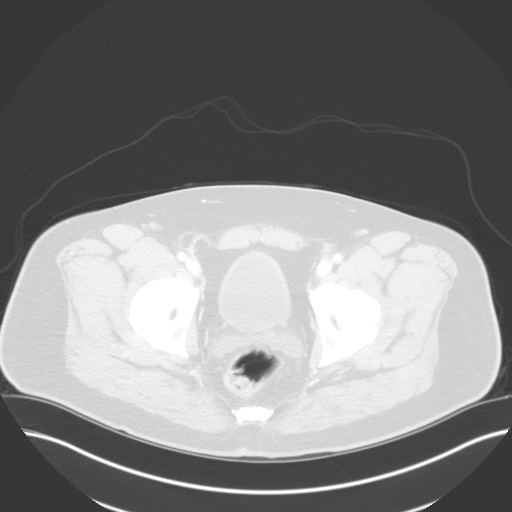
[im 19/95  bone]
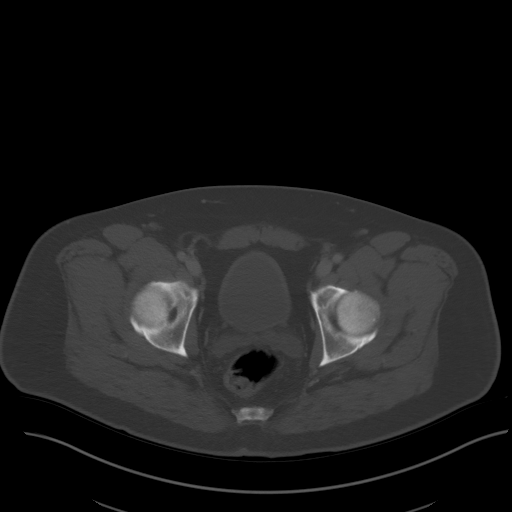
[im 38/95  soft-tissue]
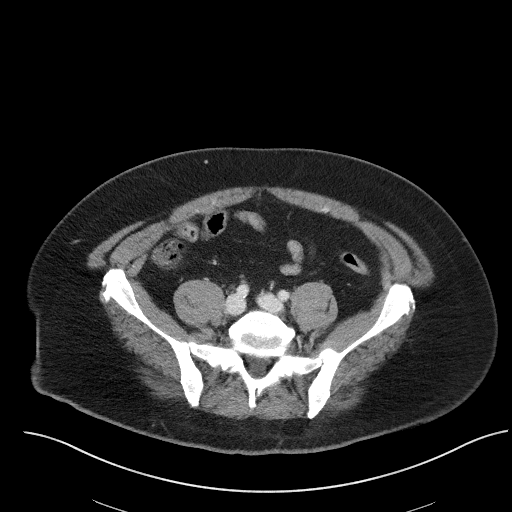
[im 38/95  lung]
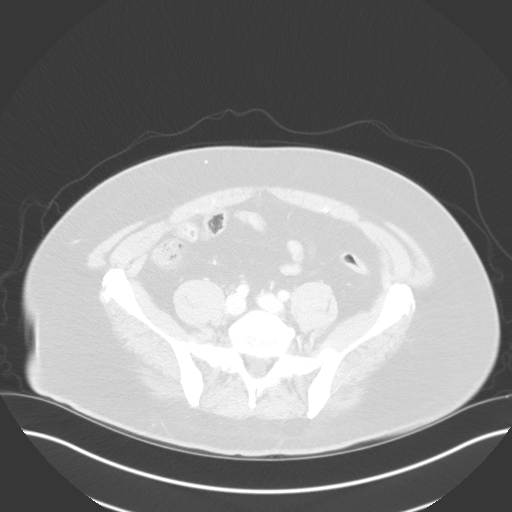
[im 57/95  soft-tissue]
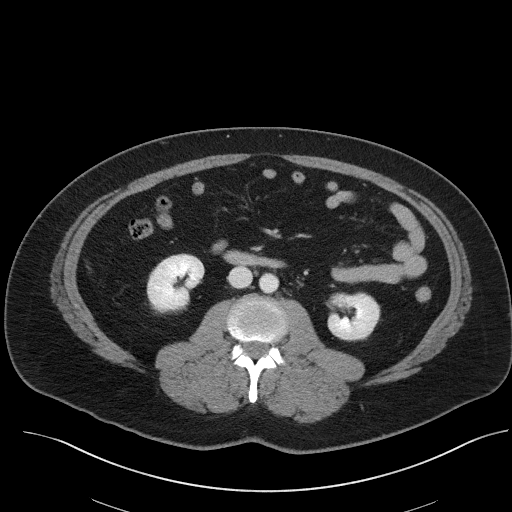
[im 57/95  lung]
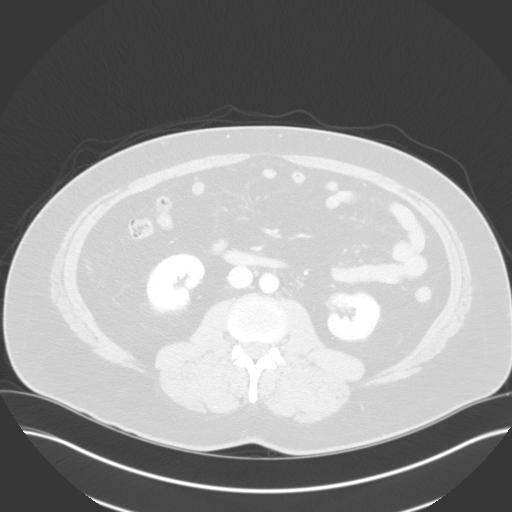
[im 76/95  soft-tissue]
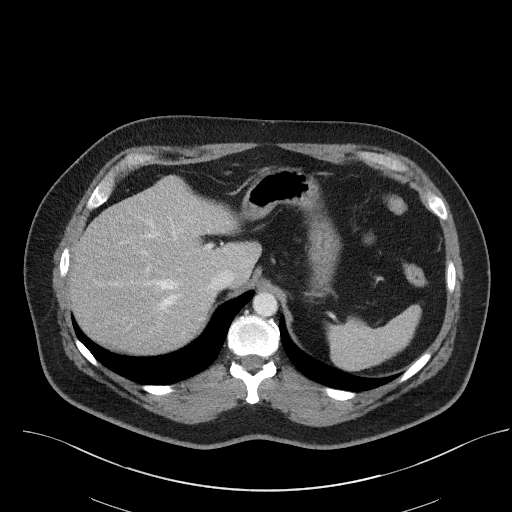
[im 76/95  lung]
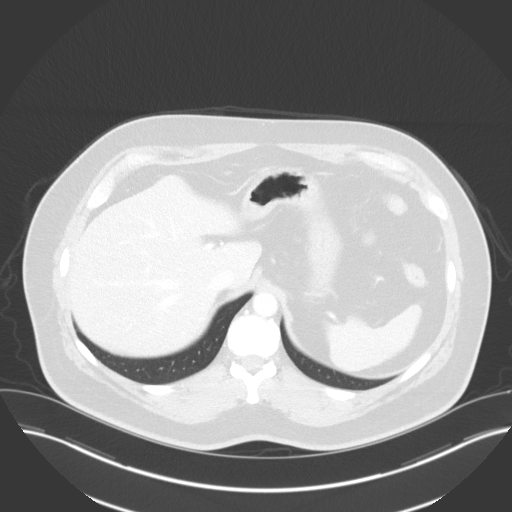

[Series 9: coronal mpr · coronal · 0.76mm/px · 2 of 142 slices shown, 3 images]
[im 48/142  soft-tissue]
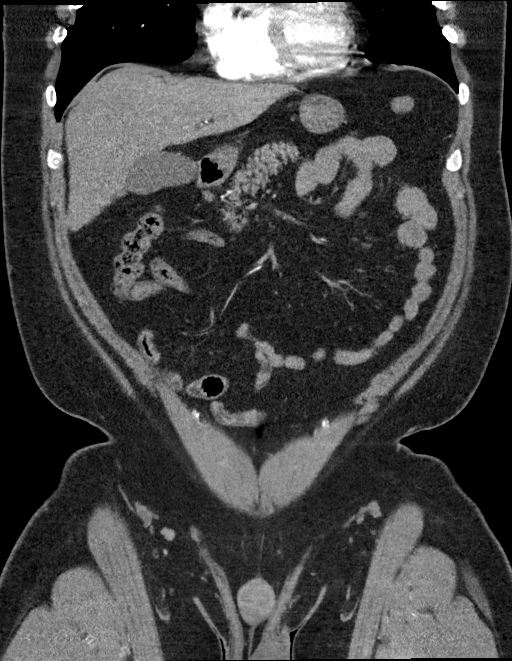
[im 48/142  bone]
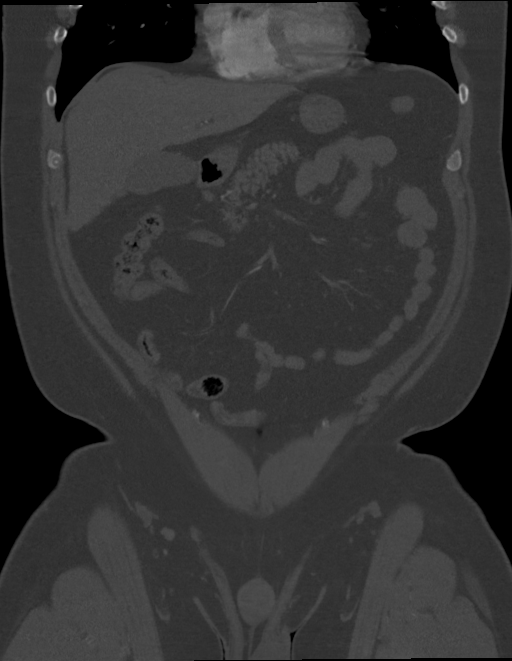
[im 95/142  soft-tissue]
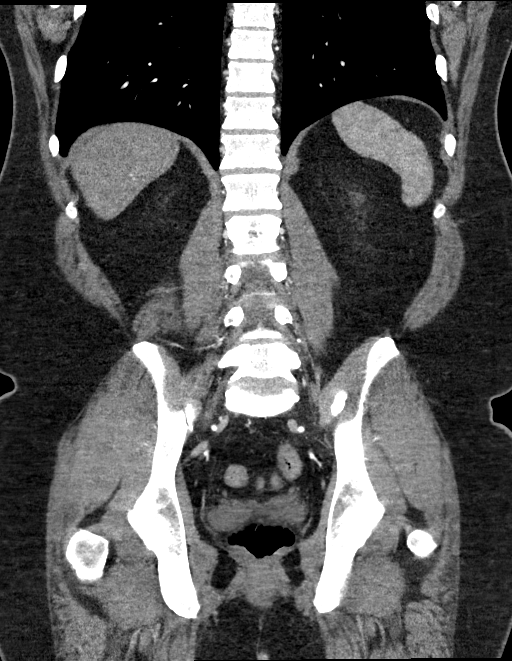

[11 of 46 positions shown; findings below may reference images not displayed]

FINDINGS: VASCULAR

Aorta: Normal caliber aorta without aneurysm, dissection, vasculitis
or significant stenosis.

Celiac: Patent without evidence of aneurysm, dissection, vasculitis
or significant stenosis.

SMA: Patent without evidence of aneurysm, dissection, vasculitis or
significant stenosis.

Renals: Both renal arteries are patent without evidence of aneurysm,
dissection, vasculitis, fibromuscular dysplasia or significant
stenosis.

IMA: Patent without evidence of aneurysm, dissection, vasculitis or
significant stenosis.

Inflow: Minimal calcified atheromatous plaque. No aneurysm,
dissection, or stenosis.

Proximal Outflow: Bilateral common femoral and visualized portions
of the superficial and profunda femoral arteries are patent without
evidence of aneurysm, dissection, vasculitis or significant
stenosis.

Veins: Patent iliac venous system and IVC, bilateral renal veins,
SMV, splenic vein, portal vein, and hepatic veins. No venous
pathology identified

Review of the MIP images confirms the above findings.

NON-VASCULAR

Lower chest: No pleural or pericardial effusion. Visualized lung
bases clear.

Hepatobiliary: No focal liver abnormality is seen. No gallstones,
gallbladder wall thickening, or biliary dilatation.

Pancreas: Unremarkable. No pancreatic ductal dilatation or
surrounding inflammatory changes.

Spleen: Normal in size without focal abnormality.

Adrenals/Urinary Tract: Normal adrenals. No hydronephrosis. 8 mm
exophytic probable cyst from the mid left kidney. No solid renal
mass. No urolithiasis. Urinary bladder incompletely distended.

Stomach/Bowel: Stomach is nondistended. Small bowel decompressed.
Normal appendix. The colon is nondilated with scattered distal
descending and sigmoid diverticula. No evidence of active
extravasation in the lumen of the bowel.

Lymphatic: No abdominal or pelvic adenopathy.

Reproductive: Prostate is unremarkable.

Other: No ascites. No free air.

Musculoskeletal: No acute or significant osseous findings.
IMPRESSION: 1. No significant vascular pathology.  No evidence of extravasation.
2. Descending and sigmoid diverticulosis.

## 2019-09-15 ENCOUNTER — Ambulatory Visit: Payer: Self-pay

## 2019-09-15 NOTE — Telephone Encounter (Signed)
Pt. Reports he took 2 Prilosec 40 mg this morning. States he called [employer] and was told to drink "extra water." States he will notify his PCP.

## 2020-01-02 ENCOUNTER — Other Ambulatory Visit: Payer: Self-pay | Admitting: Internal Medicine

## 2020-01-02 DIAGNOSIS — R140 Abdominal distension (gaseous): Secondary | ICD-10-CM

## 2020-01-12 ENCOUNTER — Other Ambulatory Visit: Payer: Self-pay | Admitting: Internal Medicine

## 2020-01-12 DIAGNOSIS — R1084 Generalized abdominal pain: Secondary | ICD-10-CM

## 2020-01-12 DIAGNOSIS — R634 Abnormal weight loss: Secondary | ICD-10-CM

## 2020-01-17 ENCOUNTER — Ambulatory Visit: Payer: BLUE CROSS/BLUE SHIELD

## 2020-01-27 ENCOUNTER — Ambulatory Visit: Payer: PRIVATE HEALTH INSURANCE

## 2020-02-03 ENCOUNTER — Ambulatory Visit
Admission: RE | Admit: 2020-02-03 | Discharge: 2020-02-03 | Disposition: A | Discharge status: Home / Self Care | Payer: PRIVATE HEALTH INSURANCE | Source: Ambulatory Visit | Attending: Internal Medicine | Admitting: Internal Medicine

## 2020-02-03 ENCOUNTER — Other Ambulatory Visit: Payer: Self-pay

## 2020-02-03 DIAGNOSIS — R634 Abnormal weight loss: Secondary | ICD-10-CM | POA: Insufficient documentation

## 2020-02-03 DIAGNOSIS — R1084 Generalized abdominal pain: Secondary | ICD-10-CM | POA: Insufficient documentation

## 2020-04-05 ENCOUNTER — Other Ambulatory Visit: Payer: PRIVATE HEALTH INSURANCE

## 2020-04-20 ENCOUNTER — Other Ambulatory Visit
Admission: RE | Admit: 2020-04-20 | Discharge: 2020-04-20 | Disposition: A | Discharge status: Home / Self Care | Payer: PRIVATE HEALTH INSURANCE | Source: Ambulatory Visit | Attending: Gastroenterology | Admitting: Gastroenterology

## 2020-04-20 ENCOUNTER — Other Ambulatory Visit: Payer: Self-pay

## 2020-04-20 DIAGNOSIS — Z20822 Contact with and (suspected) exposure to covid-19: Secondary | ICD-10-CM | POA: Diagnosis not present

## 2020-04-20 DIAGNOSIS — Z01812 Encounter for preprocedural laboratory examination: Secondary | ICD-10-CM | POA: Insufficient documentation

## 2020-04-20 LAB — SARS CORONAVIRUS 2 (TAT 6-24 HRS): SARS Coronavirus 2: NEGATIVE

## 2020-04-23 ENCOUNTER — Encounter: Payer: Self-pay | Admitting: *Deleted

## 2020-04-24 ENCOUNTER — Encounter: Payer: Self-pay | Admitting: *Deleted

## 2020-04-24 ENCOUNTER — Ambulatory Visit: Payer: PRIVATE HEALTH INSURANCE | Admitting: Certified Registered Nurse Anesthetist

## 2020-04-24 ENCOUNTER — Ambulatory Visit
Admission: RE | Admit: 2020-04-24 | Discharge: 2020-04-24 | Disposition: A | Discharge status: Home / Self Care | Payer: PRIVATE HEALTH INSURANCE | Attending: Gastroenterology | Admitting: Gastroenterology

## 2020-04-24 ENCOUNTER — Encounter
Admission: RE | Disposition: A | Discharge status: Home / Self Care | Payer: Self-pay | Source: Home / Self Care | Attending: Gastroenterology

## 2020-04-24 ENCOUNTER — Other Ambulatory Visit: Payer: Self-pay

## 2020-04-24 DIAGNOSIS — K449 Diaphragmatic hernia without obstruction or gangrene: Secondary | ICD-10-CM | POA: Insufficient documentation

## 2020-04-24 DIAGNOSIS — Z79899 Other long term (current) drug therapy: Secondary | ICD-10-CM | POA: Diagnosis not present

## 2020-04-24 DIAGNOSIS — Z886 Allergy status to analgesic agent status: Secondary | ICD-10-CM | POA: Diagnosis not present

## 2020-04-24 DIAGNOSIS — I10 Essential (primary) hypertension: Secondary | ICD-10-CM | POA: Diagnosis not present

## 2020-04-24 DIAGNOSIS — Z8711 Personal history of peptic ulcer disease: Secondary | ICD-10-CM | POA: Diagnosis not present

## 2020-04-24 DIAGNOSIS — K219 Gastro-esophageal reflux disease without esophagitis: Secondary | ICD-10-CM | POA: Insufficient documentation

## 2020-04-24 HISTORY — DX: Gastro-esophageal reflux disease without esophagitis: K21.9

## 2020-04-24 HISTORY — PX: ESOPHAGOGASTRODUODENOSCOPY (EGD) WITH PROPOFOL: SHX5813

## 2020-04-24 SURGERY — ESOPHAGOGASTRODUODENOSCOPY (EGD) WITH PROPOFOL
Anesthesia: General

## 2020-04-24 MED ORDER — SODIUM CHLORIDE 0.9 % IV SOLN: INTRAVENOUS | Status: DC

## 2020-04-24 MED ORDER — GLYCOPYRROLATE 0.2 MG/ML IJ SOLN
INTRAMUSCULAR | Status: DC | PRN
  Administered 2020-04-24: .2 mg via INTRAVENOUS

## 2020-04-24 MED ORDER — PROPOFOL 10 MG/ML IV BOLUS
INTRAVENOUS | Status: DC | PRN
  Administered 2020-04-24: 50 mg via INTRAVENOUS
  Administered 2020-04-24: 20 mg via INTRAVENOUS

## 2020-04-24 MED ORDER — FENTANYL CITRATE (PF) 100 MCG/2ML IJ SOLN
INTRAMUSCULAR | Status: AC
  Filled 2020-04-24: qty 2

## 2020-04-24 MED ORDER — PROPOFOL 10 MG/ML IV BOLUS
INTRAVENOUS | Status: AC
  Filled 2020-04-24: qty 40

## 2020-04-24 MED ORDER — PROPOFOL 500 MG/50ML IV EMUL
INTRAVENOUS | Status: AC
  Filled 2020-04-24: qty 150

## 2020-04-24 MED ORDER — FENTANYL CITRATE (PF) 100 MCG/2ML IJ SOLN
INTRAMUSCULAR | Status: DC | PRN
  Administered 2020-04-24: 50 ug via INTRAVENOUS

## 2020-04-24 MED ORDER — PROPOFOL 500 MG/50ML IV EMUL
INTRAVENOUS | Status: DC | PRN
  Administered 2020-04-24: 125 ug/kg/min via INTRAVENOUS

## 2020-04-24 MED ORDER — GLYCOPYRROLATE 0.2 MG/ML IJ SOLN
INTRAMUSCULAR | Status: AC
  Filled 2020-04-24: qty 3

## 2020-04-24 MED ORDER — LIDOCAINE HCL (PF) 2 % IJ SOLN
INTRAMUSCULAR | Status: AC
  Filled 2020-04-24: qty 30

## 2020-04-24 MED ORDER — LIDOCAINE HCL (CARDIAC) PF 100 MG/5ML IV SOSY
PREFILLED_SYRINGE | INTRAVENOUS | Status: DC | PRN
  Administered 2020-04-24: 70 mg via INTRAVENOUS

## 2020-04-24 NOTE — Op Note (Signed)
[facility] Gastroenterology Patient Name: William Ballard Procedure Date: 04/24/2020 7:33 AM MRN: [id number] Account #: [id number] Date of Birth: [birth year] Admit Type: Outpatient Age: 49 Room: [facility] ENDO ROOM 1 Gender: Male Note Status: Finalized Procedure:             Upper GI endoscopy Indications:           Gastro-esophageal reflux disease Providers:             Andrey Farmer MD, MD Referring MD:          Rusty Aus, MD (Referring MD) Medicines:             Monitored Anesthesia Care Complications:         No immediate complications. Procedure:             Pre-Anesthesia Assessment:                        - Prior to the procedure, a History and Physical was                         performed, and patient medications and allergies were                         reviewed. The patient is competent. The risks and                         benefits of the procedure and the sedation options and                         risks were discussed with the patient. All questions                         were answered and informed consent was obtained.                         Patient identification and proposed procedure were                         verified by the physician, the nurse, the anesthetist                         and the technician in the endoscopy suite. Mental                         Status Examination: alert and oriented. Airway                         Examination: normal oropharyngeal airway and neck                         mobility. Respiratory Examination: clear to                         auscultation. CV Examination: normal. Prophylactic                         Antibiotics: The patient does not require prophylactic  antibiotics. Prior Anticoagulants: The patient has                         taken no previous anticoagulant or antiplatelet                         agents. ASA Grade Assessment: II - A patient with mild                          systemic disease. After reviewing the risks and                         benefits, the patient was deemed in satisfactory                         condition to undergo the procedure. The anesthesia                         plan was to use monitored anesthesia care (MAC).                         Immediately prior to administration of medications,                         the patient was re-assessed for adequacy to receive                         sedatives. The heart rate, respiratory rate, oxygen                         saturations, blood pressure, adequacy of pulmonary                         ventilation, and response to care were monitored                         throughout the procedure. The physical status of the                         patient was re-assessed after the procedure.                        After obtaining informed consent, the endoscope was                         passed under direct vision. Throughout the procedure,                         the patient's blood pressure, pulse, and oxygen                         saturations were monitored continuously. The Endoscope                         was introduced through the mouth, and advanced to the                         second part of duodenum. The upper GI endoscopy was  accomplished without difficulty. The patient tolerated                         the procedure well. Findings:      A small hiatal hernia was present.      The exam of the esophagus was otherwise normal.      The entire examined stomach was normal.      The examined duodenum was normal. Impression:            - Small hiatal hernia.                        - Normal stomach.                        - Normal examined duodenum.                        - No specimens collected. Recommendation:        - Discharge patient to home.                        - Resume previous diet.                        - Continue present medications.                         - Return to referring physician as previously                         scheduled. Procedure Code(s):     --- Professional ---                        [phone], Esophagogastroduodenoscopy, flexible,                         transoral; diagnostic, including collection of                         specimen(s) by brushing or washing, when performed                         (separate procedure) Diagnosis Code(s):     --- Professional ---                        K44.9, Diaphragmatic hernia without obstruction or                         gangrene                        K21.9, Gastro-esophageal reflux disease without                         esophagitis CPT copyright 2019 [employer]. All rights reserved. The codes documented in this report are preliminary and upon coder review may  be revised to meet current compliance requirements. Andrey Farmer, MD Andrey Farmer MD, MD 04/24/2020 8:21:04 AM Number of Addenda: 0 Note Initiated On: 04/24/2020 7:33 AM Estimated Blood Loss:  Estimated blood loss: none.      [place]  [facility]

## 2020-04-24 NOTE — Anesthesia Postprocedure Evaluation (Signed)
Anesthesia Post Note  Patient: William Ballard  Procedure(s) Performed: ESOPHAGOGASTRODUODENOSCOPY (EGD) WITH PROPOFOL (N/A )  Patient location during evaluation: Endoscopy Anesthesia Type: General Level of consciousness: awake and awake and alert Pain management: satisfactory to patient Vital Signs Assessment: post-procedure vital signs reviewed and stable Respiratory status: spontaneous breathing and nonlabored ventilation Cardiovascular status: stable Postop Assessment: no apparent nausea or vomiting and no headache Anesthetic complications: no   No complications documented.   Last Vitals:  Vitals:   04/24/20 0735 04/24/20 0820  BP: 129/75   Pulse: 76   Resp: 18   Temp: 36.9 C 36.6 C  SpO2: 100%     Last Pain:  Vitals:   04/24/20 0820  TempSrc: Temporal  PainSc: 0-No pain                 Emilio Math

## 2020-04-24 NOTE — Transfer of Care (Signed)
Immediate Anesthesia Transfer of Care Note  Patient: William Ballard  Procedure(s) Performed: ESOPHAGOGASTRODUODENOSCOPY (EGD) WITH PROPOFOL (N/A )  Patient Location: Endoscopy Unit  Anesthesia Type:General  Level of Consciousness: awake, alert  and oriented  Airway & Oxygen Therapy: Patient Spontanous Breathing and Patient connected to nasal cannula oxygen  Post-op Assessment: Report given to RN and Post -op Vital signs reviewed and stable  Post vital signs: Reviewed and stable  Last Vitals:  Vitals Value Taken Time  BP 120/70   Temp    Pulse 77 04/24/20 0820  Resp 15 04/24/20 0820  SpO2 100 % 04/24/20 0820  Vitals shown include unvalidated device data.  Last Pain:  Vitals:   04/24/20 0735  PainSc: 0-No pain         Complications: No complications documented.

## 2020-04-24 NOTE — Interval H&P Note (Signed)
History and Physical Interval Note:  04/24/2020 8:02 AM  William Ballard  has presented today for surgery, with the diagnosis of GERD.  The various methods of treatment have been discussed with the patient and family. After consideration of risks, benefits and other options for treatment, the patient has consented to  Procedure(s): ESOPHAGOGASTRODUODENOSCOPY (EGD) WITH PROPOFOL (N/A) as a surgical intervention.  The patient's history has been reviewed, patient examined, no change in status, stable for surgery.  I have reviewed the patient's chart and labs.  Questions were answered to the patient's satisfaction.     [name]  Ok to proceed with EGD

## 2020-04-24 NOTE — Anesthesia Procedure Notes (Signed)
Procedure Name: MAC Date/Time: 04/24/2020 8:06 AM Performed by: Lily Peer, Kristjan Derner, CRNA Pre-anesthesia Checklist: Patient identified, Emergency Drugs available, Suction available and Patient being monitored Patient Re-evaluated:Patient Re-evaluated prior to induction Oxygen Delivery Method: Nasal cannula

## 2020-04-24 NOTE — Anesthesia Preprocedure Evaluation (Signed)
Anesthesia Evaluation  Patient identified by MRN, date of birth, ID band Patient awake    Reviewed: Allergy & Precautions, NPO status , Patient's Chart, lab work & pertinent test results  Airway Mallampati: II   Neck ROM: Full    Dental no notable dental hx.    Pulmonary neg pulmonary ROS,    Pulmonary exam normal breath sounds clear to auscultation       Cardiovascular hypertension, negative cardio ROS Normal cardiovascular exam Rhythm:Regular Rate:Normal     Neuro/Psych negative neurological ROS  negative psych ROS   GI/Hepatic Neg liver ROS, GERD  ,  Endo/Other  negative endocrine ROS  Renal/GU negative Renal ROS  negative genitourinary   Musculoskeletal negative musculoskeletal ROS (+)   Abdominal   Peds negative pediatric ROS (+)  Hematology negative hematology ROS (+)   Anesthesia Other Findings    Reproductive/Obstetrics negative OB ROS                             Anesthesia Physical Anesthesia Plan  ASA: II  Anesthesia Plan: General   Post-op Pain Management:    Induction: Intravenous  PONV Risk Score and Plan: 2 and Propofol infusion  Airway Management Planned: Nasal Cannula  Additional Equipment: None  Intra-op Plan:   Post-operative Plan:   Informed Consent: I have reviewed the patients History and Physical, chart, labs and discussed the procedure including the risks, benefits and alternatives for the proposed anesthesia with the patient or authorized representative who has indicated his/her understanding and acceptance.       Plan Discussed with: CRNA, Anesthesiologist and Surgeon  Anesthesia Plan Comments:         Anesthesia Quick Evaluation

## 2020-04-24 NOTE — H&P (Signed)
Outpatient short stay form Pre-procedure 04/24/2020 8:00 AM Merlyn Lot MD, MPH  Primary Physician:  Dr. Hyacinth Meeker  Reason for visit:  GERD  History of present illness:   49 y/o gentleman with refractory GERD here for EGD. No blood thinners, family history of GI malignancies, no abdominal surgeries. History of PUD.    Current Facility-Administered Medications:  .  0.9 %  sodium chloride infusion, , Intravenous, Continuous, Han Lysne, Rossie Muskrat, MD, Last Rate: 20 mL/hr at 04/24/20 0759, Continued from Pre-op at 04/24/20 0759  Medications Prior to Admission  Medication Sig Dispense Refill Last Dose  . Multiple Vitamin (MULTIVITAMIN) tablet Take 1 tablet by mouth daily.   04/23/2020 at Unknown time  . omeprazole (PRILOSEC) 40 MG capsule Take 40 mg by mouth in the morning and at bedtime.   04/23/2020 at 2300  . diphenhydrAMINE (BENADRYL) 25 MG tablet Take 25 mg by mouth every 6 (six) hours as needed.     . sucralfate (CARAFATE) 1 g tablet Take 1 g by mouth 2 (two) times daily before a meal.        Allergies  Allergen Reactions  . Tylenol [Acetaminophen] Palpitations     Past Medical History:  Diagnosis Date  . GERD (gastroesophageal reflux disease)   . Hemorrhoids   . Hypertension   . 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    Physical Exam  Gen: Alert, oriented. Appears stated age.  HEENT: PERRLA. Lungs: No respiratory distress CV: RRR Abd: soft, benign, no masses. Ext: No edema.     Planned procedures: Proceed with EGD. The patient understands the nature of the planned procedure, indications, risks, alternatives and potential complications including but not limited to bleeding, infection, perforation, damage to internal organs and possible oversedation/side effects from anesthesia. The patient agrees and gives consent to proceed.  Please refer to procedure notes for findings, recommendations and patient  disposition/instructions.     Merlyn Lot MD, MPH Gastroenterology 04/24/2020  8:00 AM

## 2020-04-25 ENCOUNTER — Encounter: Payer: Self-pay | Admitting: Gastroenterology

## 2020-09-03 IMAGING — US US ABDOMEN COMPLETE
1 series · 14 of 25 positions shown · non-contrast
Comparison: CT 01/03/2019

CLINICAL DATA: Generalized abdominal pain

EXAM:
ABDOMEN ULTRASOUND COMPLETE

[Series 1: us abdomen complete · 0.23mm/px · 14 of 79 slices shown]
[im 1/79]
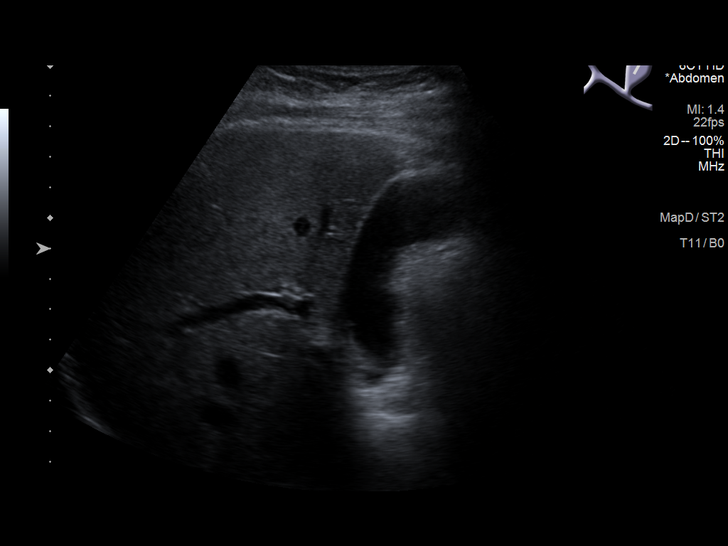
[im 7/79]
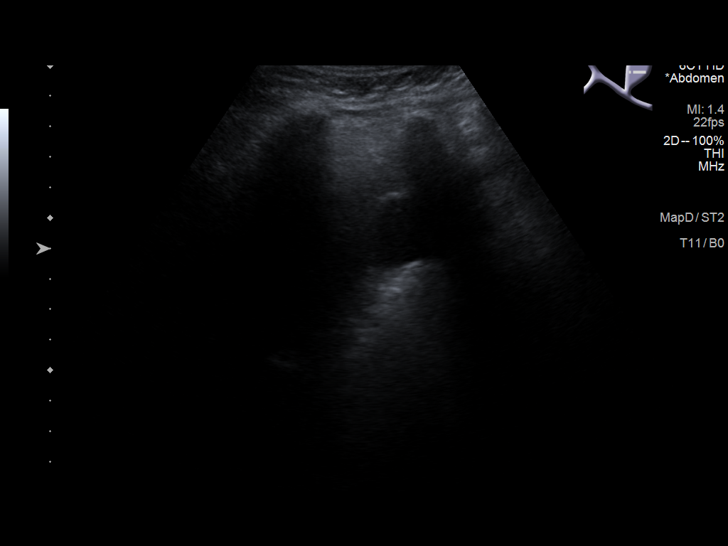
[im 14/79]
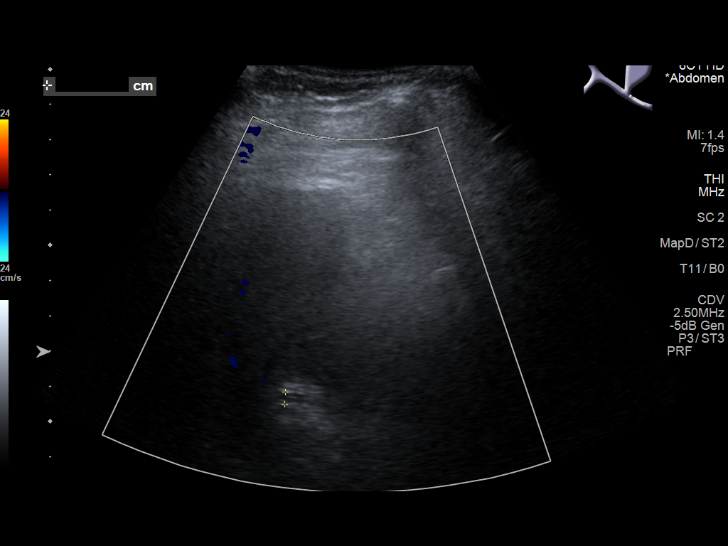
[im 20/79]
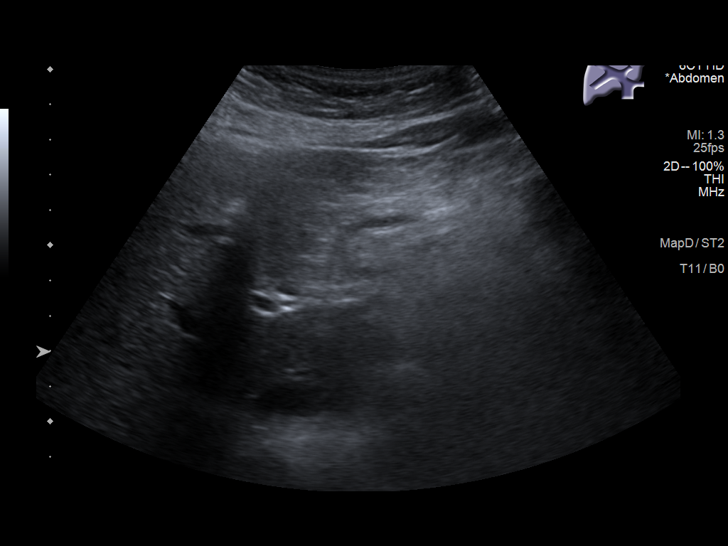
[im 27/79]
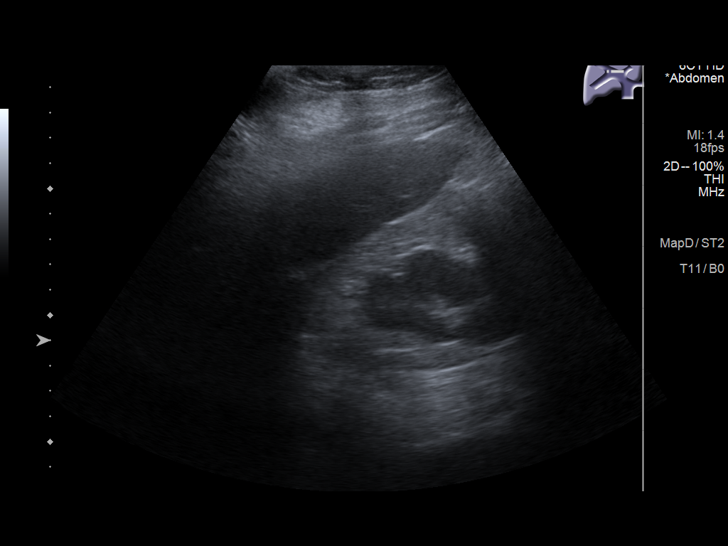
[im 30/79]
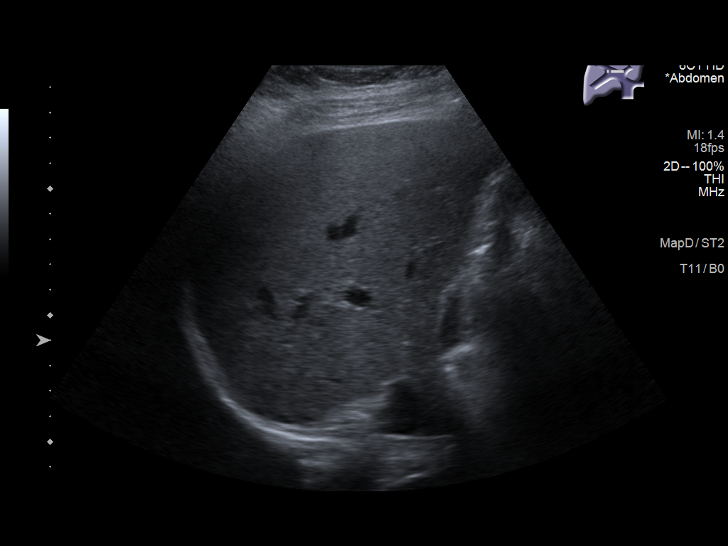
[im 36/79]
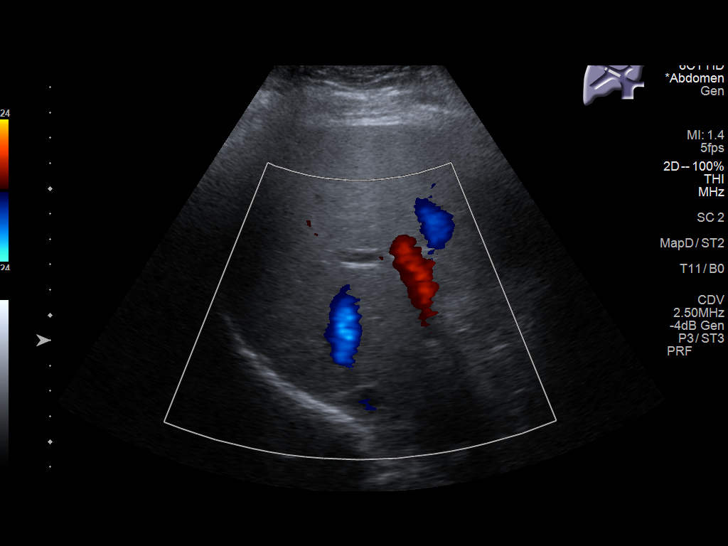
[im 43/79]
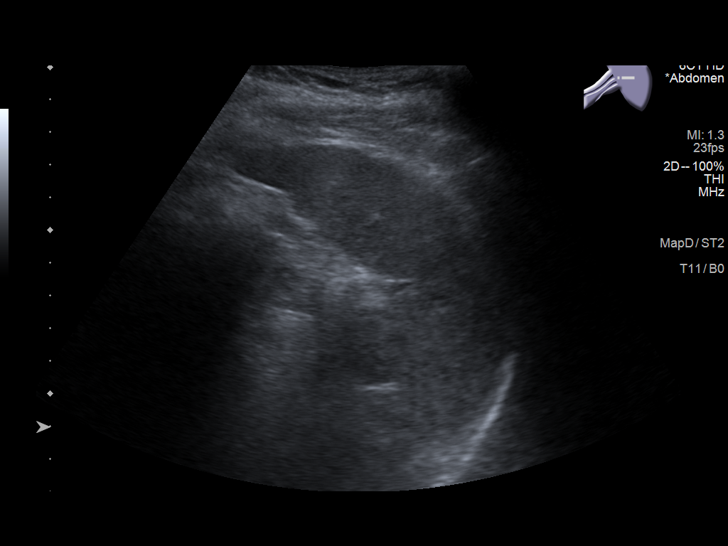
[im 49/79]
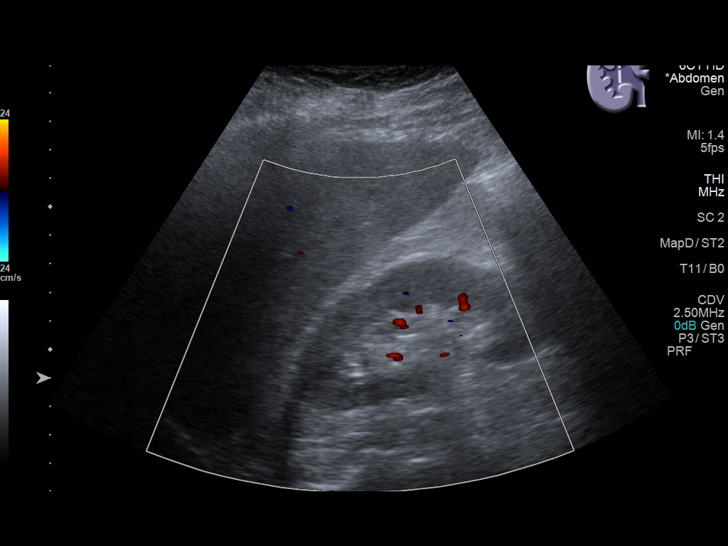
[im 53/79]
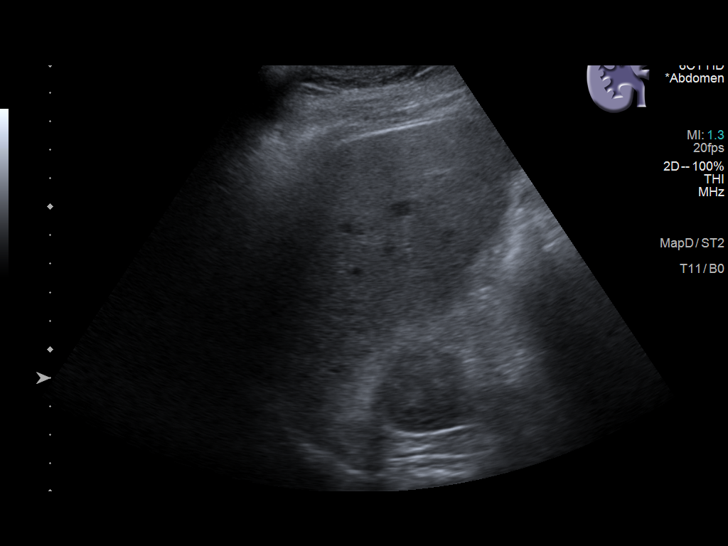
[im 59/79]
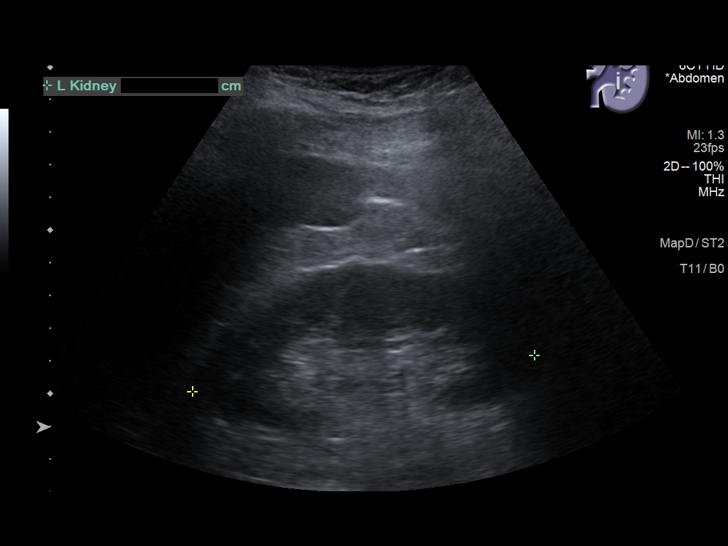
[im 66/79]
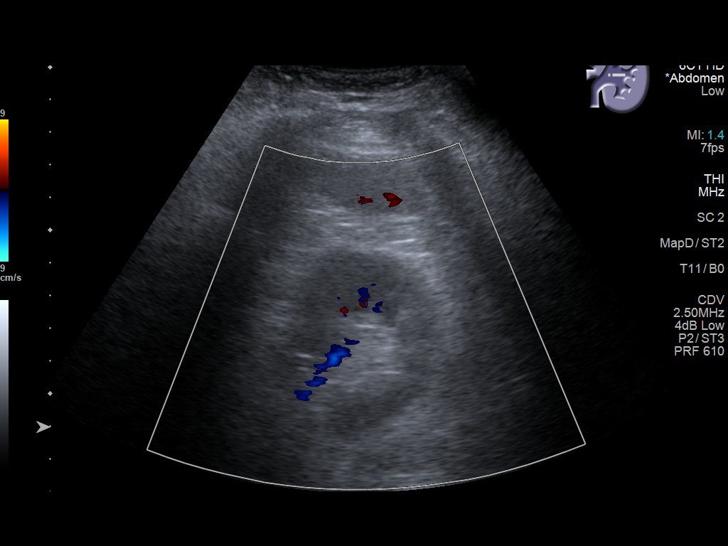
[im 72/79]
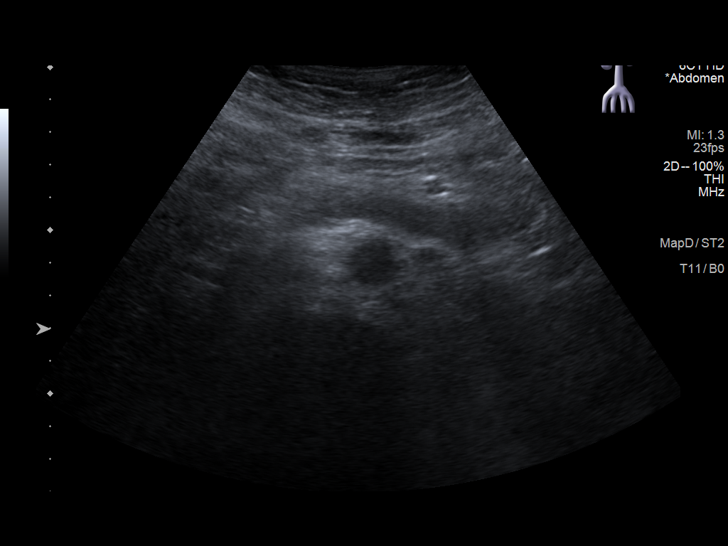
[im 79/79]
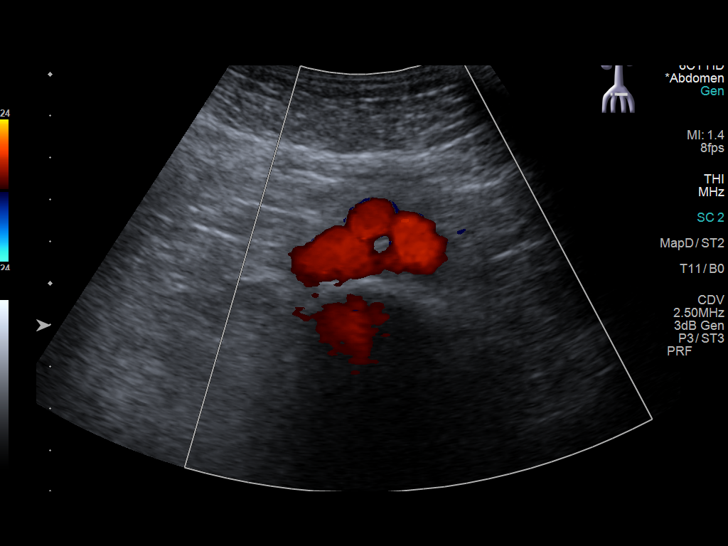

[14 of 25 positions shown; findings below may reference images not displayed]

FINDINGS: Gallbladder: No gallstones or wall thickening visualized. No
sonographic Murphy sign noted by sonographer.

Common bile duct: Diameter: 2 mm

Liver: No focal lesion identified. Within normal limits in
parenchymal echogenicity. Portal vein is patent on color Doppler
imaging with normal direction of blood flow towards the liver.

IVC: No abnormality visualized.

Pancreas: Visualized portion unremarkable.

Spleen: Size and appearance within normal limits.

Right Kidney: Length: 9.6 cm. Echogenicity within normal limits. No
mass or hydronephrosis visualized.

Left Kidney: Length: 10.5 cm. Echogenicity within normal limits. No
mass or hydronephrosis visualized.

Abdominal aorta: No aneurysm visualized.

Other findings: None.
IMPRESSION: Negative examination
